# Patient Record
Sex: Female | Born: 1990
Health system: Southern US, Community
[De-identification: ages and names within clinical notes are randomized; demographics above are authoritative.]

## PROBLEM LIST (undated history)

## (undated) DIAGNOSIS — F419 Anxiety disorder, unspecified: Secondary | ICD-10-CM

---
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
## 2010-09-30 ENCOUNTER — Ambulatory Visit: Payer: Self-pay | Admitting: Internal Medicine

## 2011-02-04 ENCOUNTER — Ambulatory Visit: Payer: Self-pay | Admitting: Internal Medicine

## 2011-10-31 ENCOUNTER — Ambulatory Visit: Payer: Self-pay

## 2011-12-10 ENCOUNTER — Ambulatory Visit: Payer: Self-pay | Admitting: Internal Medicine

## 2011-12-10 LAB — RAPID STREP-A WITH REFLX: Micro Text Report: NEGATIVE

## 2011-12-12 LAB — BETA STREP CULTURE(ARMC)

## 2012-03-15 ENCOUNTER — Ambulatory Visit: Payer: Self-pay

## 2012-08-17 ENCOUNTER — Ambulatory Visit: Payer: Self-pay | Admitting: Emergency Medicine

## 2012-08-17 LAB — RAPID INFLUENZA A&B ANTIGENS

## 2012-08-17 LAB — RAPID STREP-A WITH REFLX: Micro Text Report: NEGATIVE

## 2012-08-20 LAB — BETA STREP CULTURE(ARMC)

## 2013-07-24 ENCOUNTER — Ambulatory Visit: Payer: Self-pay | Admitting: Family Medicine

## 2013-07-24 LAB — RAPID INFLUENZA A&B ANTIGENS

## 2013-08-09 DIAGNOSIS — J338 Other polyp of sinus: Secondary | ICD-10-CM | POA: Insufficient documentation

## 2014-07-05 DIAGNOSIS — O039 Complete or unspecified spontaneous abortion without complication: Secondary | ICD-10-CM | POA: Insufficient documentation

## 2018-07-29 DIAGNOSIS — F419 Anxiety disorder, unspecified: Secondary | ICD-10-CM

## 2018-07-29 HISTORY — DX: Anxiety disorder, unspecified: F41.9

## 2019-02-09 NOTE — Progress Notes (Signed)
PCP:  Patient, No Pcp Per   Chief Complaint  Patient presents with  . Gynecologic Exam     HPI:      Carol Steele is a 28 y.o. No obstetric history on file. who LMP was Patient's last menstrual period was 02/06/2019 (exact date)., presents today for her NP annual examination.  Her menses are regular every 28-30 days, lasting 5 days.  Dysmenorrhea mild, occurring first 1-2 days of flow. She does not have intermenstrual bleeding.  Sex activity: single partner, contraception - OCP (estrogen/progesterone). She is thinking about conception in a few months. Not taking PNVs currently. Last Pap: not recent; no hx of abn Hx of STDs: none  There is a FH of breast cancer in her MGM, genetic testing not indicated. There is no FH of ovarian cancer. The patient does not do self-breast exams.  Tobacco use: The patient denies current or previous tobacco use. Alcohol use: none No drug use.  Exercise: moderately active  She does get adequate calcium and Vitamin D in her diet.  Hx of borderline thyroid in past and was treated with synthroid short-term. Never had any sx. Subsequent thyroid checks normal after stopping med. Due for recheck.  Past Medical History:  Diagnosis Date  . Anxiety 2020    History reviewed. No pertinent surgical history.  Family History  Problem Relation Age of Onset  . Anxiety disorder Mother   . Hypertension Father   . Heart attack Father   . Breast cancer Maternal Grandmother 60       has contact  . Prostate cancer Maternal Grandfather 60  . Diabetes Maternal Grandfather   . Heart failure Maternal Grandfather   . Kidney disease Maternal Grandfather     Social History   Socioeconomic History  . Marital status: Married    Spouse name: Not on file  . Number of children: Not on file  . Years of education: Not on file  . Highest education level: Not on file  Occupational History  . Not on file  Social Needs  . Financial resource strain: Not on file   . Food insecurity    Worry: Not on file    Inability: Not on file  . Transportation needs    Medical: Not on file    Non-medical: Not on file  Tobacco Use  . Smoking status: Former Games developermoker  . Smokeless tobacco: Never Used  Substance and Sexual Activity  . Alcohol use: Yes    Comment: occ  . Drug use: Never  . Sexual activity: Yes    Birth control/protection: Pill  Lifestyle  . Physical activity    Days per week: Not on file    Minutes per session: Not on file  . Stress: Not on file  Relationships  . Social Musicianconnections    Talks on phone: Not on file    Gets together: Not on file    Attends religious service: Not on file    Active member of club or organization: Not on file    Attends meetings of clubs or organizations: Not on file    Relationship status: Not on file  . Intimate partner violence    Fear of current or ex partner: Not on file    Emotionally abused: Not on file    Physically abused: Not on file    Forced sexual activity: Not on file  Other Topics Concern  . Not on file  Social History Narrative  . Not on file  Outpatient Medications Prior to Visit  Medication Sig Dispense Refill  . SPRINTEC 28 0.25-35 MG-MCG tablet      No facility-administered medications prior to visit.       ROS:  Review of Systems  Constitutional: Negative for fatigue, fever and unexpected weight change.  Respiratory: Negative for cough, shortness of breath and wheezing.   Cardiovascular: Negative for chest pain, palpitations and leg swelling.  Gastrointestinal: Negative for blood in stool, constipation, diarrhea, nausea and vomiting.  Endocrine: Negative for cold intolerance, heat intolerance and polyuria.  Genitourinary: Negative for dyspareunia, dysuria, flank pain, frequency, genital sores, hematuria, menstrual problem, pelvic pain, urgency, vaginal bleeding, vaginal discharge and vaginal pain.  Musculoskeletal: Negative for back pain, joint swelling and myalgias.   Skin: Negative for rash.  Neurological: Negative for dizziness, syncope, light-headedness, numbness and headaches.  Hematological: Negative for adenopathy.  Psychiatric/Behavioral: Negative for agitation, confusion, sleep disturbance and suicidal ideas. The patient is not nervous/anxious.    BREAST: No symptoms   Objective: BP 112/78   Ht 5\' 5"  (1.651 m)   Wt 147 lb 3.2 oz (66.8 kg)   LMP 02/06/2019 (Exact Date)   BMI 24.50 kg/m    Physical Exam Constitutional:      Appearance: She is well-developed.  Genitourinary:     Vulva, vagina, cervix, uterus, right adnexa and left adnexa normal.     No vulval lesion or tenderness noted.     No vaginal discharge, erythema or tenderness.     No cervical polyp.     Uterus is not enlarged or tender.     No right or left adnexal mass present.     Right adnexa not tender.     Left adnexa not tender.  Neck:     Musculoskeletal: Normal range of motion.     Thyroid: No thyromegaly.  Cardiovascular:     Rate and Rhythm: Normal rate and regular rhythm.     Heart sounds: Normal heart sounds. No murmur.  Pulmonary:     Effort: Pulmonary effort is normal.     Breath sounds: Normal breath sounds.  Chest:     Breasts:        Right: No mass, nipple discharge, skin change or tenderness.        Left: No mass, nipple discharge, skin change or tenderness.  Abdominal:     Palpations: Abdomen is soft.     Tenderness: There is no abdominal tenderness. There is no guarding.  Musculoskeletal: Normal range of motion.  Neurological:     General: No focal deficit present.     Mental Status: She is alert and oriented to person, place, and time.     Cranial Nerves: No cranial nerve deficit.  Skin:    General: Skin is warm and dry.  Psychiatric:        Mood and Affect: Mood normal.        Behavior: Behavior normal.        Thought Content: Thought content normal.        Judgment: Judgment normal.  Vitals signs reviewed.     Assessment/Plan:  Encounter for annual routine gynecological examination -   Cervical cancer screening - Plan: Cytology - PAP,  Encounter for surveillance of contraceptive pills - Plan: Pt to cont OCPs for now. Has Rx. Probably wants to stop soon for conception. F/u prn for Rx.  Pre-conception counseling - Plan: start PNVs.  Thyroid disorder screening - Plan: TSH + free T4, Will call with results.  GYN counsel adequate intake of calcium and vitamin D, diet and exercise     F/U  Return in about 1 year (around 02/10/2020).   B. , PA-C 02/10/2019 11:46 AM

## 2019-02-10 ENCOUNTER — Other Ambulatory Visit: Payer: Self-pay

## 2019-02-10 ENCOUNTER — Other Ambulatory Visit (HOSPITAL_COMMUNITY)
Admission: RE | Admit: 2019-02-10 | Discharge: 2019-02-10 | Disposition: A | Discharge status: Home / Self Care | Payer: BC Managed Care – PPO | Source: Ambulatory Visit | Attending: Obstetrics and Gynecology | Admitting: Obstetrics and Gynecology

## 2019-02-10 ENCOUNTER — Encounter: Payer: Self-pay | Admitting: Obstetrics and Gynecology

## 2019-02-10 ENCOUNTER — Ambulatory Visit (INDEPENDENT_AMBULATORY_CARE_PROVIDER_SITE_OTHER): Payer: BC Managed Care – PPO | Admitting: Obstetrics and Gynecology

## 2019-02-10 VITALS — BP 112/78 | Ht 65.0 in | Wt 147.2 lb

## 2019-02-10 DIAGNOSIS — Z124 Encounter for screening for malignant neoplasm of cervix: Secondary | ICD-10-CM | POA: Diagnosis present

## 2019-02-10 DIAGNOSIS — Z01419 Encounter for gynecological examination (general) (routine) without abnormal findings: Secondary | ICD-10-CM | POA: Diagnosis not present

## 2019-02-10 DIAGNOSIS — Z3041 Encounter for surveillance of contraceptive pills: Secondary | ICD-10-CM

## 2019-02-10 DIAGNOSIS — Z3169 Encounter for other general counseling and advice on procreation: Secondary | ICD-10-CM

## 2019-02-10 DIAGNOSIS — Z1329 Encounter for screening for other suspected endocrine disorder: Secondary | ICD-10-CM

## 2019-02-10 NOTE — Patient Instructions (Signed)
I value your feedback and entrusting us with your care. If you get a [facility] patient survey, I would appreciate you taking the time to let us know about your experience today. Thank you! 

## 2019-02-11 LAB — CYTOLOGY - PAP: Diagnosis: NEGATIVE

## 2019-02-11 LAB — TSH+FREE T4
Free T4: 1.23 ng/dL (ref 0.82–1.77)
TSH: 2.87 u[IU]/mL (ref 0.450–4.500)

## 2019-02-11 NOTE — Progress Notes (Signed)
Pls let pt know thyroid labs normal. Thx.

## 2019-02-11 NOTE — Progress Notes (Signed)
Pt aware.

## 2019-02-11 NOTE — Progress Notes (Signed)
Called, no answer, LVMTRC. 

## 2019-08-12 ENCOUNTER — Other Ambulatory Visit: Payer: Self-pay

## 2019-08-12 ENCOUNTER — Encounter: Payer: Self-pay | Admitting: Advanced Practice Midwife

## 2019-08-12 ENCOUNTER — Other Ambulatory Visit (HOSPITAL_COMMUNITY)
Admission: RE | Admit: 2019-08-12 | Discharge: 2019-08-12 | Disposition: A | Discharge status: Home / Self Care | Payer: BC Managed Care – PPO | Source: Ambulatory Visit | Attending: Obstetrics and Gynecology | Admitting: Obstetrics and Gynecology

## 2019-08-12 ENCOUNTER — Ambulatory Visit (INDEPENDENT_AMBULATORY_CARE_PROVIDER_SITE_OTHER): Payer: BC Managed Care – PPO | Admitting: Advanced Practice Midwife

## 2019-08-12 ENCOUNTER — Encounter: Payer: BC Managed Care – PPO | Admitting: Obstetrics and Gynecology

## 2019-08-12 VITALS — BP 118/74 | Wt 145.0 lb

## 2019-08-12 DIAGNOSIS — Z3481 Encounter for supervision of other normal pregnancy, first trimester: Secondary | ICD-10-CM

## 2019-08-12 DIAGNOSIS — Z3400 Encounter for supervision of normal first pregnancy, unspecified trimester: Secondary | ICD-10-CM | POA: Diagnosis not present

## 2019-08-12 DIAGNOSIS — Z113 Encounter for screening for infections with a predominantly sexual mode of transmission: Secondary | ICD-10-CM | POA: Insufficient documentation

## 2019-08-12 DIAGNOSIS — Z3A01 Less than 8 weeks gestation of pregnancy: Secondary | ICD-10-CM | POA: Diagnosis not present

## 2019-08-12 NOTE — Progress Notes (Signed)
NOB today. LMP 06/20/2019 

## 2019-08-12 NOTE — Progress Notes (Signed)
New Obstetric Patient H&P    Chief Complaint: "Desires prenatal care"   History of Present Illness: Patient is a 29 y.o. G2P0010 Not Hispanic or Latino female, presents with amenorrhea and positive home pregnancy test. Patient's last menstrual period was 06/20/2019 (exact date). and based on her  LMP, her EDD is Estimated Date of Delivery: 03/26/20 and her EGA is [redacted]w[redacted]d. Cycles are 5. days, regular, and occur approximately every : 28 days. Her last pap smear was 6 months ago and was no abnormalities.    She had a urine pregnancy test which was positive 3 week(s)  ago. Her last menstrual period was normal and lasted for  5 day(s). Since her LMP she claims she has experienced breast tenderness and fatigue. She denies vaginal bleeding. Her past medical history is noncontributory. Her prior pregnancies are notable for SAB in 2015 without complication  Since her LMP, she admits to the use of tobacco products  no She claims she has gained   5 pounds since the start of her pregnancy.  There are cats in the home in the home  yes If yes Indoor She admits close contact with children on a regular basis  no  She has had chicken pox in the past yes She has had Tuberculosis exposures, symptoms, or previously tested positive for TB   no Current or past history of domestic violence. no  Genetic Screening/Teratology Counseling: (Includes patient, baby's father, or anyone in either family with:)   1. Patient's age >/= 42 at [facility]  no 2. Thalassemia ([place], [place], Mediterranean, or Asian background): MCV<80  no 3. Neural tube defect (meningomyelocele, spina bifida, anencephaly)  no 4. Congenital heart defect  no  5. Down syndrome  no 6. Tay-Sachs (Jewish, [place]  no 7. Canavan's Disease  no 8. Sickle cell disease or trait (African)  no  9. Hemophilia or other blood disorders  no  10. Muscular dystrophy  no  11. Cystic fibrosis  no  12. Huntington's Chorea  no  13. Mental retardation/autism   no 14. Other inherited genetic or chromosomal disorder  no 15. Maternal metabolic disorder (DM, PKU, etc)  no 16. Patient or FOB with a child with a birth defect not listed above no  16a. Patient or FOB with a birth defect themselves no 17. Recurrent pregnancy loss, or stillbirth  no  18. Any medications since LMP other than prenatal vitamins (include vitamins, supplements, OTC meds, drugs, alcohol)  no 19. Any other genetic/environmental exposure to discuss  no  Infection History:   1. Lives with someone with TB or TB exposed  no  2. Patient or partner has history of genital herpes  no 3. Rash or viral illness since LMP  no 4. History of STI (GC, CT, HPV, syphilis, HIV)  no 5. History of recent travel :  no  Other pertinent information:  no     Review of Systems:10 point review of systems negative unless otherwise noted in HPI  Past Medical History:  Past Medical History:  Diagnosis Date  . Anxiety 2020    Past Surgical History:  History reviewed. No pertinent surgical history.  Gynecologic History: Patient's last menstrual period was 06/20/2019 (exact date).  Obstetric History: G2P0010  Family History:  Family History  Problem Relation Age of Onset  . Anxiety disorder Mother   . Hypertension Father   . Heart attack Father   . Breast cancer Maternal Grandmother 60       has contact  .  Prostate cancer Maternal Grandfather 60  . Diabetes Maternal Grandfather   . Heart failure Maternal Grandfather   . Kidney disease Maternal Grandfather     Social History:  Social History   Socioeconomic History  . Marital status: Married    Spouse name: Not on file  . Number of children: Not on file  . Years of education: Not on file  . Highest education level: Not on file  Occupational History  . Not on file  Tobacco Use  . Smoking status: Former Research scientist (life sciences)  . Smokeless tobacco: Never Used  Substance and Sexual Activity  . Alcohol use: Not Currently    Comment: occ  .  Drug use: Never  . Sexual activity: Yes    Birth control/protection: None  Other Topics Concern  . Not on file  Social History Narrative  . Not on file   Social Determinants of Health   Financial Resource Strain:   . Difficulty of Paying Living Expenses: Not on file  Food Insecurity:   . Worried About Charity fundraiser in the Last Year: Not on file  . Ran Out of Food in the Last Year: Not on file  Transportation Needs:   . Lack of Transportation (Medical): Not on file  . Lack of Transportation (Non-Medical): Not on file  Physical Activity:   . Days of Exercise per Week: Not on file  . Minutes of Exercise per Session: Not on file  Stress:   . Feeling of Stress : Not on file  Social Connections:   . Frequency of Communication with Friends and Family: Not on file  . Frequency of Social Gatherings with Friends and Family: Not on file  . Attends Religious Services: Not on file  . Active Member of Clubs or Organizations: Not on file  . Attends Archivist Meetings: Not on file  . Marital Status: Not on file  Intimate Partner Violence:   . Fear of Current or Ex-Partner: Not on file  . Emotionally Abused: Not on file  . Physically Abused: Not on file  . Sexually Abused: Not on file    Allergies:  Allergies  Allergen Reactions  . Amoxicillin Rash  . Cefaclor Hives and Rash  . Clarithromycin Rash  . Penicillins Hives and Rash  . Sulfamethoxazole-Trimethoprim Rash    Medications: Prior to Admission medications   Medication Sig Start Date End Date Taking? Authorizing Provider  Prenatal Vit-Fe Fumarate-FA (PRENATAL VITAMINS PO) Take by mouth.   Yes [provider]    Physical Exam Vitals: Blood pressure 118/74, weight 145 lb (65.8 kg), last menstrual period 06/20/2019.  General: NAD HEENT: normocephalic, anicteric Thyroid: no enlargement, no palpable nodules Pulmonary: No increased work of breathing, CTAB Cardiovascular: RRR, distal pulses 2+  Abdomen: NABS, soft, non-tender, non-distended.  Umbilicus without lesions.  No hepatomegaly, splenomegaly or masses palpable. No evidence of hernia  Genitourinary: deferred for no concerns/PAP interval/early pregnancy/pt collected aptima Extremities: no edema, erythema, or tenderness Neurologic: Grossly intact Psychiatric: mood appropriate, affect full   Assessment: 29 y.o. G2P0010 at [redacted]w[redacted]d presenting to initiate prenatal care  Plan: 1) Avoid alcoholic beverages. 2) Patient encouraged not to smoke.  3) Discontinue the use of all non-medicinal drugs and chemicals.  4) Take prenatal vitamins daily.  5) Nutrition, food safety (fish, cheese advisories, and high nitrite foods) and exercise discussed. 6) Hospital and practice style discussed with cross coverage system.  7) Genetic Screening, such as with 1st Trimester Screening, cell free fetal DNA, AFP testing, and  Ultrasound, as well as with amniocentesis and CVS as appropriate, is discussed with patient. At the conclusion of today's visit patient requested cell free DNA genetic testing if covered by her insurance and otherwise requests 1st trimester/NT scan 8) Patient is asked about travel to areas at risk for the [place] virus, and counseled to avoid travel and exposure to mosquitoes or sexual partners who may have themselves been exposed to the virus. Testing is discussed, and will be ordered as appropriate.  9) Urine culture, aptima today 10) Return in 1 week for dating scan and rob 89) MaterniT 21 and NOB panel at 10+ weeks   Tresea Mall, CNM [facility] 08/12/2019, 10:18 AM

## 2019-08-12 NOTE — Patient Instructions (Signed)
Genetic Testing During Pregnancy Genetic testing during pregnancy is also called prenatal genetic testing. This type of testing can determine if your baby is at risk of being born with a disorder caused by abnormal genes or chromosomes (genetic disorder). Chromosomes contain genes that control how your baby will develop in your womb. There are many different genetic disorders. Examples of genetic disorders that may be found through genetic testing include Down syndrome and cystic fibrosis. Gene changes (mutations) can be passed down through families. Genetic testing is offered to all women before or during pregnancy. You can choose whether to have genetic testing. Why is genetic testing done? Genetic testing is done during pregnancy to find out whether your child is at risk for a genetic disorder. Having genetic testing allows you to:  Discuss your test results and options with a genetic counselor.  Prepare for a baby that may be born with a genetic disorder. Learning about the disorder ahead of time helps you be better prepared to manage it. Your health care providers can also be prepared in case your baby requires special care before or after birth.  Consider whether you want to continue with the pregnancy. In some cases, genetic testing may be done to learn about the traits a child will inherit. Types of genetic tests There are two basic types of genetic testing. Screening tests indicate whether your developing baby (fetus) is at higher risk for a genetic disorder. Diagnostic tests check actual fetal cells to diagnose a genetic disorder. Screening tests     Screening tests will not harm your baby. They are recommended for all pregnant women. Types of screening tests include:  Carrier screening. This test involves checking genes from both parents by testing their blood or saliva. The test checks to find out if the parents carry a genetic mutation that may be passed to a baby. In most cases,  both parents must carry the mutation for a baby to be at risk.  First trimester screening. This test combines a blood test with sound wave imaging of your baby (fetal ultrasound). This screening test checks for a risk of Down syndrome or other defects caused by having extra chromosomes. It also checks for defects of the heart, abdomen, or skeleton.  Second trimester screening also combines a blood test with a fetal ultrasound exam. It checks for a risk of genetic defects of the face, brain, spine, heart, or limbs.  Combined or sequential screening. This type of testing combines the results of first and second trimester screening. This type of testing may be more accurate than first or second trimester screening alone.  Cell-free DNA testing. This is a blood test that detects cells released by the placenta that get into the mother's blood. It can be used to check for a risk of Down syndrome, other extra chromosome syndromes, and disorders caused by abnormal numbers of sex chromosomes. This test can be done any time after 10 weeks of pregnancy.  Diagnostic tests Diagnostic tests carry slight risks of problems, including bleeding, infection, and loss of the pregnancy. These tests are done only if your baby is at risk for a genetic disorder. You may meet with a genetic counselor to discuss the risks and benefits before having diagnostic tests. Examples of diagnostic tests include:  Chorionic villus sampling (CVS). This involves a procedure to remove and test a sample of cells taken from the placenta. The procedure may be done between 10 and 12 weeks of pregnancy.  Amniocentesis. This involves a  procedure to remove and test a sample of fluid (amniotic fluid) and cells from the sac that surrounds the developing baby. The procedure may be done between 15 and 20 weeks of pregnancy. What do the results mean? For a screening test:  If the results are negative, it often means that your child is not at higher  risk. There is still a slight chance your child could have a genetic disorder.  If the results are positive, it does not mean your child will have a genetic disorder. It may mean that your child has a higher-than-normal risk for a genetic disorder. In that case, you may want to talk with a genetic counselor about whether you should have diagnostic genetic tests. For a diagnostic test:  If the result is negative, it is unlikely that your child will have a genetic disorder.  If the test is positive for a genetic disorder, it is likely that your child will have the disorder. The test may not tell how severe the disorder will be. Talk with your health care provider about your options. Questions to ask your health care provider Before talking to your health care provider about genetic testing, find out if there is a history of genetic disorders in your family. It may also help to know your family's ethnic origins. Then ask your health care provider the following questions:  Is my baby at risk for a genetic disorder?  What are the benefits of having genetic screening?  What tests are best for me and my baby?  What are the risks of each test?  If I get a positive result on a screening test, what is the next step?  Should I meet with a genetic counselor before having a diagnostic test?  Should my partner or other members of my family be tested?  How much do the tests cost? Will my insurance cover the testing? Summary  Genetic testing is done during pregnancy to find out whether your child is at risk for a genetic disorder.  Genetic testing is offered to all women before or during pregnancy. You can choose whether to have genetic testing.  There are two basic types of genetic testing. Screening tests indicate whether your developing baby (fetus) is at higher risk for a genetic disorder. Diagnostic tests check actual fetal cells to diagnose a genetic disorder.  If a diagnostic genetic test is  positive, talk with your health care provider about your options. This information is not intended to replace advice given to you by your health care provider. Make sure you discuss any questions you have with your health care provider. Document Revised: 11/05/2018 Document Reviewed: 09/29/2017 Elsevier Patient Education  2020 [employer] Perinatal Anxiety When a woman feels excessive tension or worry (anxiety) during pregnancy or during the first 12 months after she gives birth, she has a condition called perinatal anxiety. Anxiety can interfere with work, school, relationships, and other everyday activities. If it is not managed properly, it can also cause problems in the mother and her baby.  If you are pregnant and you have symptoms of an anxiety disorder, it is important to talk with your health care provider. What are the causes? The exact cause of this condition is not known. Hormonal changes during and after pregnancy may play a role in causing perinatal anxiety. What increases the risk? You are more likely to develop this condition if:  You have a personal or family history of depression, anxiety, or mood disorders.  You experience  a stressful life event during pregnancy, such as the death of a loved one.  You have a lot of regular life stress, such as being a single parent.  You have thyroid problems. What are the signs or symptoms? Perinatal anxiety can be different for everyone. It may include:  Panic attacks (panic disorder). These are intense episodes of fear or discomfort that may also cause sweating, nausea, shortness of breath, or fear of dying. They usually last 5-15 minutes.  Reliving an upsetting (traumatic) event through distressing thoughts, dreams, or flashbacks (post-traumatic stress disorder, or PTSD).  Excessive worry about multiple problems (generalized anxiety disorder).  Fear and stress about leaving certain people or loved ones (separation  anxiety).  Performing repetitive tasks (compulsions) to relieve stress or worry (obsessive compulsive disorder, or OCD).  Fear of certain objects or situations (phobias).  Excessive worrying, such as a constant feeling that something bad is going to happen.  Inability to relax.  Difficulty concentrating.  Sleep problems.  Frequent nightmares or disturbing thoughts. How is this diagnosed? This condition is diagnosed based on a physical exam and mental evaluation. In some cases, your health care provider may use an anxiety screening tool. These tools include a list of questions that can help a health care provider diagnose anxiety. Your health care provider may refer you to a mental health expert who specializes in anxiety. How is this treated? This condition may be treated with:  Medicines. Your health care provider will only give you medicines that have been proven safe for pregnancy and breastfeeding.  Talk therapy with a mental health professional to help change your patterns of thinking (cognitive behavioral therapy).  Mindfulness-based stress reduction.  Other relaxation therapies, such as deep breathing or guided muscle relaxation.  Support groups. Follow these instructions at home: Lifestyle  Do not use any products that contain nicotine or tobacco, such as cigarettes and e-cigarettes. If you need help quitting, ask your health care provider.  Do not use alcohol when you are pregnant. After your baby is born, limit alcohol intake to no more than 1 drink a day. One drink equals 12 oz of beer, 5 oz of wine, or 1 oz of hard liquor.  Consider joining a support group for new mothers. Ask your health care provider for recommendations.  Take good care of yourself. Make sure you: ? Get plenty of sleep. If you are having trouble sleeping, talk with your health care provider. ? Eat a healthy diet. This includes plenty of fruits and vegetables, whole grains, and lean  proteins. ? Exercise regularly, as told by your health care provider. Ask your health care provider what exercises are safe for you. General instructions  Take over-the-counter and prescription medicines only as told by your health care provider.  Talk with your partner or family members about your feelings during pregnancy. Share any concerns or fears that you may have.  Ask for help with tasks or chores when you need it. Ask friends and family members to provide meals, watch your children, or help with cleaning.  Keep all follow-up visits as told by your health care provider. This is important. Contact a health care provider if:  You (or people close to you) notice that you have any symptoms of anxiety or depression.  You have anxiety and your symptoms get worse.  You experience side effects from medicines, such as nausea or sleep problems. Get help right away if:  You feel like hurting yourself, your baby, or someone else. If  you ever feel like you may hurt yourself or others, or have thoughts about taking your own life, get help right away. You can go to your nearest emergency department or call:  Your local emergency services (911 in the [place]  A suicide crisis helpline, such as the National Suicide Prevention Lifeline at 209-883-88701-317-709-1483. This is open 24 hours a day. Summary  Perinatal anxiety is when a woman feels excessive tension or worry during pregnancy or during the first 12 months after she gives birth.  Perinatal anxiety may include panic attacks, post-traumatic stress disorder, separation anxiety, phobias, or generalized anxiety.  Perinatal anxiety can cause physical health problems in the mother and baby if not properly managed.  This condition is treated with medicines, talk therapy, stress reduction therapies, or a combination of two or more treatments.  Talk with your partner or family members about your concerns or fears. Do not be afraid to ask for help. This  information is not intended to replace advice given to you by your health care provider. Make sure you discuss any questions you have with your health care provider. Document Revised: 07/18/2017 Document Reviewed: 09/11/2016 Elsevier Patient Education  2020 [employer] Exercise During Pregnancy Exercise is an important part of being healthy for people of all ages. Exercise improves the function of your heart and lungs and helps you maintain strength, flexibility, and a healthy body weight. Exercise also boosts energy levels and elevates mood. Most women should exercise regularly during pregnancy. In rare cases, women with certain medical conditions or complications may be asked to limit or avoid exercise during pregnancy. How does this affect me? Along with maintaining general strength and flexibility, exercising during pregnancy can help:  Keep strength in muscles that are used during labor and childbirth.  Decrease low back pain.  Reduce symptoms of depression.  Control weight gain during pregnancy.  Reduce the risk of needing insulin if you develop diabetes during pregnancy.  Decrease the risk of cesarean delivery.  Speed up your recovery after giving birth. How does this affect my baby? Exercise can help you have a healthy pregnancy. Exercise does not cause premature birth. It will not cause your baby to weigh less at birth. What exercises can I do? Many exercises are safe for you to do during pregnancy. Do a variety of exercises that safely increase your heart and breathing rates and help you build and maintain muscle strength. Do exercises exactly as told by your health care provider. You may do these exercises:  Walking or hiking.  Swimming.  Water aerobics.  Riding a stationary bike.  Strength training.  Modified yoga or Pilates. Tell your instructor that you are pregnant. Avoid overstretching, and avoid lying on your back for long periods of time.  Running or  jogging. Only choose this type of exercise if you: ? Ran or jogged regularly before your pregnancy. ? Can run or jog and still talk in complete sentences. What exercises should I avoid? Depending on your level of fitness and whether you exercised regularly before your pregnancy, you may be told to limit high-intensity exercise. You can tell that you are exercising at a high intensity if you are breathing much harder and faster and cannot hold a conversation while exercising. You must avoid:  Contact sports.  Activities that put you at risk for falling on or being hit in the belly, such as downhill skiing, water skiing, surfing, rock climbing, cycling, gymnastics, and horseback riding.  Scuba diving.  Skydiving.  Yoga  or Pilates in a room that is heated to high temperatures.  Jogging or running, unless you ran or jogged regularly before your pregnancy. While jogging or running, you should always be able to talk in full sentences. Do not run or jog so fast that you are unable to have a conversation.  Do not exercise at more than 6,000 feet above sea level (high elevation) if you are not used to exercising at high elevation. How do I exercise in a safe way?   Avoid overheating. Do not exercise in very high temperatures.  Wear loose-fitting, breathable clothes.  Avoid dehydration. Drink enough water before, during, and after exercise to keep your urine pale yellow.  Avoid overstretching. Because of hormone changes during pregnancy, it is easy to overstretch muscles, tendons, and ligaments during pregnancy.  Start slowly and ask your health care provider to recommend the types of exercise that are safe for you.  Do not exercise to lose weight. Follow these instructions at home:  Exercise on most days or all days of the week. Try to exercise for 30 minutes a day, 5 days a week, unless your health care provider tells you not to.  If you actively exercised before your pregnancy and you  are healthy, your health care provider may tell you to continue to do moderate to high-intensity exercise.  If you are just starting to exercise or did not exercise much before your pregnancy, your health care provider may tell you to do low to moderate-intensity exercise. Questions to ask your health care provider  Is exercise safe for me?  What are signs that I should stop exercising?  Does my health condition mean that I should not exercise during pregnancy?  When should I avoid exercising during pregnancy? Stop exercising and contact a health care provider if: You have any unusual symptoms, such as:  Mild contractions of the uterus or cramps in the abdomen.  Dizziness that does not go away when you rest. Stop exercising and get help right away if: You have any unusual symptoms, such as:  Sudden, severe pain in your low back or your belly.  Mild contractions of the uterus or cramps in the abdomen that do not improve with rest and drinking fluids.  Chest pain.  Bleeding or fluid leaking from your vagina.  Shortness of breath. These symptoms may represent a serious problem that is an emergency. Do not wait to see if the symptoms will go away. Get medical help right away. Call your local emergency services (911 in the [place] Do not drive yourself to the hospital. Summary  Most women should exercise regularly throughout pregnancy. In rare cases, women with certain medical conditions or complications may be asked to limit or avoid exercise during pregnancy.  Do not exercise to lose weight during pregnancy.  Your health care provider will tell you what level of physical activity is right for you.  Stop exercising and contact a health care provider if you have mild contractions of the uterus or cramps in the abdomen. Get help right away if these contractions or cramps do not improve with rest and drinking fluids.  Stop exercising and get help right away if you have sudden, severe  pain in your low back or belly, chest pain, shortness of breath, or bleeding or leaking of fluid from your vagina. This information is not intended to replace advice given to you by your health care provider. Make sure you discuss any questions you have with your health care  provider. Document Revised: 11/05/2018 Document Reviewed: 08/19/2018 Elsevier Patient Education  2020 [employer]. Eating Plan for Pregnant Women While you are pregnant, your body requires additional nutrition to help support your growing baby. You also have a higher need for some vitamins and minerals, such as folic acid, calcium, iron, and vitamin D. Eating a healthy, well-balanced diet is very important for your health and your baby's health. Your need for extra calories varies for the three 59-month segments of your pregnancy (trimesters). For most women, it is recommended to consume:  150 extra calories a day during the first trimester.  300 extra calories a day during the second trimester.  300 extra calories a day during the third trimester. What are tips for following this plan?   Do not try to lose weight or go on a diet during pregnancy.  Limit your overall intake of foods that have "empty calories." These are foods that have little nutritional value, such as sweets, desserts, candies, and sugar-sweetened beverages.  Eat a variety of foods (especially fruits and vegetables) to get a full range of vitamins and minerals.  Take a prenatal vitamin to help meet your additional vitamin and mineral needs during pregnancy, specifically for folic acid, iron, calcium, and vitamin D.  Remember to stay active. Ask your health care provider what types of exercise and activities are safe for you.  Practice good food safety and cleanliness. Wash your hands before you eat and after you prepare raw meat. Wash all fruits and vegetables well before peeling or eating. Taking these actions can help to prevent food-borne illnesses  that can be very dangerous to your baby, such as listeriosis. Ask your health care provider for more information about listeriosis. What does 150 extra calories look like? Healthy options that provide 150 extra calories each day could be any of the following:  6-8 oz (170-230 g) of plain low-fat yogurt with  cup of berries.  1 apple with 2 teaspoons (11 g) of peanut butter.  Cut-up vegetables with  cup (60 g) of hummus.  8 oz (230 mL) or 1 cup of low-fat chocolate milk.  1 stick of string cheese with 1 medium orange.  1 peanut butter and jelly sandwich that is made with one slice of whole-wheat bread and 1 tsp (5 g) of peanut butter. For 300 extra calories, you could eat two of those healthy options each day. What is a healthy amount of weight to gain? The right amount of weight gain for you is based on your BMI before you became pregnant. If your BMI:  Was less than 18 (underweight), you should gain 28-40 lb (13-18 kg).  Was 18-24.9 (normal), you should gain 25-35 lb (11-16 kg).  Was 25-29.9 (overweight), you should gain 15-25 lb (7-11 kg).  Was 30 or greater (obese), you should gain 11-20 lb (5-9 kg). What if I am having twins or multiples? Generally, if you are carrying twins or multiples:  You may need to eat 300-600 extra calories a day.  The recommended range for total weight gain is 25-54 lb (11-25 kg), depending on your BMI before pregnancy.  Talk with your health care provider to find out about nutritional needs, weight gain, and exercise that is right for you. What foods can I eat?  Fruits All fruits. Eat a variety of colors and types of fruit. Remember to wash your fruits well before peeling or eating. Vegetables All vegetables. Eat a variety of colors and types of vegetables. Remember to wash  your vegetables well before peeling or eating. Grains All grains. Choose whole grains, such as whole-wheat bread, oatmeal, or brown rice. Meats and other protein  foods Lean meats, including chicken, [place], fish, and lean cuts of beef, veal, or pork. If you eat fish or seafood, choose options that are higher in omega-3 fatty acids and lower in mercury, such as salmon, herring, mussels, trout, sardines, pollock, shrimp, crab, and lobster. Tofu. Tempeh. Beans. Eggs. Peanut butter and other nut butters. Make sure that all meats, poultry, and eggs are cooked to food-safe temperatures or "well-done." Two or more servings of fish are recommended each week in order to get the most benefits from omega-3 fatty acids that are found in seafood. Choose fish that are lower in mercury. You can find more information online:  [employer]ee Dairy Pasteurized milk and milk alternatives (such as almond milk). Pasteurized yogurt and pasteurized cheese. Cottage cheese. Sour cream. Beverages Water. Juices that contain 100% fruit juice or vegetable juice. Caffeine-free teas and decaffeinated coffee. Drinks that contain caffeine are okay to drink, but it is better to avoid caffeine. Keep your total caffeine intake to less than 200 mg each day (which is 12 oz or 355 mL of coffee, tea, or soda) or the limit as told by your health care provider. Fats and oils Fats and oils are okay to include in moderation. Sweets and desserts Sweets and desserts are okay to include in moderation. Seasoning and other foods All pasteurized condiments. The items listed above may not be a complete list of foods and beverages you can eat. Contact a dietitian for more information. What foods are not recommended? Fruits Unpasteurized fruit juices. Vegetables Raw (unpasteurized) vegetable juices. Meats and other protein foods Lunch meats, bologna, hot dogs, or other deli meats. (If you must eat those meats, reheat them until they are steaming hot.) Refrigerated pat, meat spreads from a meat counter, smoked seafood that is found in the refrigerated section of a store. Raw or undercooked meats, poultry,  and eggs. Raw fish, such as sushi or sashimi. Fish that have high mercury content, such as tilefish, shark, swordfish, and king mackerel. To learn more about mercury in fish, talk with your health care provider or look for online resources, such as:  [employer]ee Dairy Raw (unpasteurized) milk and any foods that have raw milk in them. Soft cheeses, such as feta, queso blanco, queso fresco, Brie, Camembert cheeses, blue-veined cheeses, and Panela cheese (unless it is made with pasteurized milk, which must be stated on the label). Beverages Alcohol. Sugar-sweetened beverages, such as sodas, teas, or energy drinks. Seasoning and other foods Homemade fermented foods and drinks, such as pickles, sauerkraut, or kombucha drinks. (Store-bought pasteurized versions of these are okay.) Salads that are made in a store or deli, such as ham salad, chicken salad, egg salad, tuna salad, and seafood salad. The items listed above may not be a complete list of foods and beverages you should avoid. Contact a dietitian for more information. Where to find more information To calculate the number of calories you need based on your height, weight, and activity level, you can use an online calculator such as:  PackageNews.is To calculate how much weight you should gain during pregnancy, you can use an online pregnancy weight gain calculator such as:  http://jones-berg.com/ Summary  While you are pregnant, your body requires additional nutrition to help support your growing baby.  Eat a variety of foods, especially fruits and vegetables to get a full range of vitamins and  minerals.  Practice good food safety and cleanliness. Wash your hands before you eat and after you prepare raw meat. Wash all fruits and vegetables well before peeling or eating. Taking these actions can help to prevent food-borne illnesses, such as listeriosis, that can be very dangerous to your  baby.  Do not eat raw meat or fish. Do not eat fish that have high mercury content, such as tilefish, shark, swordfish, and king mackerel. Do not eat unpasteurized (raw) dairy.  Take a prenatal vitamin to help meet your additional vitamin and mineral needs during pregnancy, specifically for folic acid, iron, calcium, and vitamin D. This information is not intended to replace advice given to you by your health care provider. Make sure you discuss any questions you have with your health care provider. Document Revised: 12/03/2018 Document Reviewed: 04/11/2017 Elsevier Patient Education  2020 [employer]. Prenatal Care Prenatal care is health care during pregnancy. It helps you and your unborn baby (fetus) stay as healthy as possible. Prenatal care may be provided by a midwife, a family practice health care provider, or a childbirth and pregnancy specialist (obstetrician). How does this affect me? During pregnancy, you will be closely monitored for any new conditions that might develop. To lower your risk of pregnancy complications, you and your health care provider will talk about any underlying conditions you have. How does this affect my baby? Early and consistent prenatal care increases the chance that your baby will be healthy during pregnancy. Prenatal care lowers the risk that your baby will be:  Born early (prematurely).  Smaller than expected at birth (small for gestational age). What can I expect at the first prenatal care visit? Your first prenatal care visit will likely be the longest. You should schedule your first prenatal care visit as soon as you know that you are pregnant. Your first visit is a good time to talk about any questions or concerns you have about pregnancy. At your visit, you and your health care provider will talk about:  Your medical history, including: ? Any past pregnancies. ? Your family's medical history. ? The baby's father's medical history. ? Any  long-term (chronic) health conditions you have and how you manage them. ? Any surgeries or procedures you have had. ? Any current over-the-counter or prescription medicines, herbs, or supplements you are taking.  Other factors that could pose a risk to your baby, including:  Your home setting and your stress levels, including: ? Exposure to abuse or violence. ? Household financial strain. ? Mental health conditions you have.  Your daily health habits, including diet and exercise. Your health care provider will also:  Measure your weight, height, and blood pressure.  Do a physical exam, including a pelvic and breast exam.  Perform blood tests and urine tests to check for: ? Urinary tract infection. ? Sexually transmitted infections (STIs). ? Low iron levels in your blood (anemia). ? Blood type and certain proteins on red blood cells (Rh antibodies). ? Infections and immunity to viruses, such as hepatitis B and rubella. ? HIV (human immunodeficiency virus).  Do an ultrasound to confirm your baby's growth and development and to help predict your estimated due date (EDD). This ultrasound is done with a probe that is inserted into the vagina (transvaginal ultrasound).  Discuss your options for genetic screening.  Give you information about how to keep yourself and your baby healthy, including: ? Nutrition and taking vitamins. ? Physical activity. ? How to manage pregnancy symptoms such as nausea  and vomiting (morning sickness). ? Infections and substances that may be harmful to your baby and how to avoid them. ? Food safety. ? Dental care. ? Working. ? Travel. ? Warning signs to watch for and when to call your health care provider. How often will I have prenatal care visits? After your first prenatal care visit, you will have regular visits throughout your pregnancy. The visit schedule is often as follows:  Up to week 28 of pregnancy: once every 4 weeks.  28-36 weeks: once  every 2 weeks.  After 36 weeks: every week until delivery. Some women may have visits more or less often depending on any underlying health conditions and the health of the baby. Keep all follow-up and prenatal care visits as told by your health care provider. This is important. What happens during routine prenatal care visits? Your health care provider will:  Measure your weight and blood pressure.  Check for fetal heart sounds.  Measure the height of your uterus in your abdomen (fundal height). This may be measured starting around week 20 of pregnancy.  Check the position of your baby inside your uterus.  Ask questions about your diet, sleeping patterns, and whether you can feel the baby move.  Review warning signs to watch for and signs of labor.  Ask about any pregnancy symptoms you are having and how you are dealing with them. Symptoms may include: ? Headaches. ? Nausea and vomiting. ? Vaginal discharge. ? Swelling. ? Fatigue. ? Constipation. ? Any discomfort, including back or pelvic pain. Make a list of questions to ask your health care provider at your routine visits. What tests might I have during prenatal care visits? You may have blood, urine, and imaging tests throughout your pregnancy, such as:  Urine tests to check for glucose, protein, or signs of infection.  Glucose tests to check for a form of diabetes that can develop during pregnancy (gestational diabetes mellitus). This is usually done around week 24 of pregnancy.  An ultrasound to check your baby's growth and development and to check for birth defects. This is usually done around week 20 of pregnancy.  A test to check for group B strep (GBS) infection. This is usually done around week 36 of pregnancy.  Genetic testing. This may include blood or imaging tests, such as an ultrasound. Some genetic tests are done during the first trimester and some are done during the second trimester. What else can I expect  during prenatal care visits? Your health care provider may recommend getting certain vaccines during pregnancy. These may include:  A yearly flu shot (annual influenza vaccine). This is especially important if you will be pregnant during flu season.  Tdap (tetanus, diphtheria, pertussis) vaccine. Getting this vaccine during pregnancy can protect your baby from whooping cough (pertussis) after birth. This vaccine may be recommended between weeks 27 and 36 of pregnancy. Later in your pregnancy, your health care provider may give you information about:  Childbirth and breastfeeding classes.  Choosing a health care provider for your baby.  Umbilical cord banking.  Breastfeeding.  Birth control after your baby is born.  The hospital labor and delivery unit and how to tour it.  Registering at the hospital before you go into labor. Where to find more information  Office on Women's Health: TravelLesson.cawomenshealth.gov  American Pregnancy Association: americanpregnancy.org  March of Dimes: marchofdimes.org Summary  Prenatal care helps you and your baby stay as healthy as possible during pregnancy.  Your first prenatal care visit will  most likely be the longest.  You will have visits and tests throughout your pregnancy to monitor your health and your baby's health.  Bring a list of questions to your visits to ask your health care provider.  Make sure to keep all follow-up and prenatal care visits with your health care provider. This information is not intended to replace advice given to you by your health care provider. Make sure you discuss any questions you have with your health care provider. Document Revised: 11/04/2018 Document Reviewed: 07/14/2017 Elsevier Patient Education  2020 [employer].    COVID-19 and Your Pregnancy FAQ  How can I prevent infection with COVID-19 during my pregnancy? Social distancing is key. Please limit any interactions in public. Try and work from home if  possible. Frequently wash your hands after touching possibly contaminated surfaces. Avoid touching your face.  Minimize trips to the store. Consider online ordering when possible.   Should I wear a mask? YES. It is recommended by the CDC that all people wear a cloth mask or facial covering in public. You should wear a mask to your visits in the office. This will help reduce transmission as well as your risk or acquiring COVID-19. New studies are showing that even asymptomatic individuals can spread the virus from talking.   Where can I get a mask? [place] and the city of [place] are partnering to provide masks to community members. You can pick up a mask from several locations. This website also has instructions about how to make a mask by sewing or without sewing by using a t-shirt or bandana.  https://www.[place].gov/i-want-to/learn-about/covid-19-information-and-updates/covid-19-face-mask-project  Studies have shown that if you were a tube or nylon stocking from pantyhose over a cloth mask it makes the cloth mask almost as effective as a N95 mask.  AntiquesInvestors.de  What are the symptoms of COVID-19? Fever (greater than 100.4 F), dry cough, shortness of breath.  Am I more at risk for COVID-19 since I am pregnant? There is not currently data showing that pregnant women are more adversely impacted by COVID-19 than the general population. However, we know that pregnant women tend to have worse respiratory complications from similar diseases such as the flu and SARS and for this reason should be considered an at-risk population.  What do I do if I am experiencing the symptoms of COVID-19? Testing is being limited because of test availability. If you are experiencing symptoms you should quarantine yourself, and the members of your family, for at least 2 weeks  at home.   Please visit this website for more information: DiscoHelp.si.html  When should I go to the Emergency Room? Please go to the emergency room if you are experiencing ANY of these symptoms*:  1.    Difficulty breathing or shortness of breath 2.    Persistent pain or pressure in the chest 3.    Confusion or difficulty being aroused (or awakened) 4.    Bluish lips or face  *This list is not all inclusive. Please consult our office for any other symptoms that are severe or concerning.  What do I do if I am having difficulty breathing? You should go to the Emergency Room for evaluation. At this time they have a tent set up for evaluating patients with COVID-19 symptoms.   How will my prenatal care be different because of the COVID-19 pandemic? It has been recommended to reduce the frequency of face-to-face visits and use resources such as telephone and virtual visits when possible. Using a  scale, blood pressure machine and fetal doppler at home can further help reduce face-to-face visits. You will be provided with additional information on this topic.  We ask that you come to your visits alone to minimize potential exposures to  COVID-19.  How can I receive childbirth education? At this time in-person classes have been cancelled. You can register for online childbirth education, breastfeeding, and newborn care classes.  Please visit:  [email or web address].is for more information  How will my hospital birth experience be different? The hospital is currently limiting visitors. This means that while you are in labor you can only have one person at the hospital with you. Additional family members will not be allowed to wait in the building or outside your room. Your one support person can be the father of the baby, a relative, a doula, or a friend. Once one support person is designated that person will wear a band. This band  cannot be shared with multiple people.  Nitrous Gas is not being offered for pain relief since the tubing and filter for the machine can not be sanitized in a way to guarantee prevention of transmission of COVID-19.  Nasal cannula use of oxygen for fetal indications has also been discontinued.  Currently a clear plastic sheet is being hung between mom and the delivering provider during pushing and delivery to help prevent transmission of COVID-19.      How long will I stay in the hospital for after giving birth? It is also recommended that discharge home be expedited during the COVID-19 outbreak. This means staying for 1 day after a vaginal delivery and 2 days after a cesarean section. Patients who need to stay longer for medical reasons are allowed to do so, but the goal will be for expedited discharge home.   What if I have COVID-19 and I am in labor? We ask that you wear a mask while on labor and delivery. We will try and accommodate you being placed in a room that is capable of filtering the air. Please call ahead if you are in labor and on your way to the hospital. The phone number for labor and delivery at [facility] is [phone].  If I have COVID-19 when my baby is born how can I prevent my baby from contracting COVID-19? This is an issue that will have to be discussed on a case-by-case basis. Current recommendations suggest providing separate isolation rooms for both the mother and new infant as well as limiting visitors. However, there are practical challenges to this recommendation. The situation will assuredly change and decisions will be influenced by the desires of the mother and availability of space.  Some suggestions are the use of a curtain or physical barrier between mom and infant, hand hygiene, mom wearing a mask, or 6 feet of spacing between a mom and infant.   Can I breastfeed during the COVID-19 pandemic?   Yes, breastfeeding is encouraged.  Can I  breastfeed if I have COVID-19? Yes. Covid-19 has not been found in breast milk. This means you cannot give COVID-19 to your child through breast milk. Breast feeding will also help pass antibodies to fight infection to your baby.   What precautions should I take when breastfeeding if I have COVID-19? If a mother and newborn do room-in and the mother wishes to feed at the breast, she should put on a facemask and practice hand hygiene before each feeding.  What precautions should I take when pumping if I  have COVID-19? Prior to expressing breast milk, mothers should practice hand hygiene. After each pumping session, all parts that come into contact with breast milk should be thoroughly washed and the entire pump should be appropriately disinfected per the manufacturer's instructions. This expressed breast milk should be fed to the newborn by a healthy caregiver.  What if I am pregnant and work in healthcare? Based on limited data regarding COVID-19 and pregnancy, ACOG currently does not propose creating additional restrictions on pregnant health care personnel because of COVID-19 alone. Pregnant women do not appear to be at higher risk of severe disease related to COVID-19. Pregnant health care personnel should follow CDC risk assessment and infection control guidelines for health care personnel exposed to patients with suspected or confirmed COVID-19. Adherence to recommended infection prevention and control practices is an important part of protecting all health care personnel in health care settings.    Information on COVID-19 in pregnancy is very limited; however, facilities may want to consider limiting exposure of pregnant health care personnel to patients with confirmed or suspected COVID-19 infection, especially during higher-risk procedures (eg, aerosol-generating procedures), if feasible, based on staffing availability.

## 2019-08-13 LAB — CERVICOVAGINAL ANCILLARY ONLY
Chlamydia: NEGATIVE
Comment: NEGATIVE
Comment: NEGATIVE
Comment: NORMAL
Neisseria Gonorrhea: NEGATIVE
Trichomonas: NEGATIVE

## 2019-08-14 LAB — URINE CULTURE: Organism ID, Bacteria: NO GROWTH

## 2019-08-20 ENCOUNTER — Ambulatory Visit (INDEPENDENT_AMBULATORY_CARE_PROVIDER_SITE_OTHER): Payer: BC Managed Care – PPO | Admitting: Certified Nurse Midwife

## 2019-08-20 ENCOUNTER — Other Ambulatory Visit: Payer: Self-pay

## 2019-08-20 ENCOUNTER — Ambulatory Visit (INDEPENDENT_AMBULATORY_CARE_PROVIDER_SITE_OTHER): Payer: BC Managed Care – PPO

## 2019-08-20 VITALS — BP 120/90

## 2019-08-20 DIAGNOSIS — Z3A08 8 weeks gestation of pregnancy: Secondary | ICD-10-CM

## 2019-08-20 DIAGNOSIS — Z3689 Encounter for other specified antenatal screening: Secondary | ICD-10-CM | POA: Diagnosis not present

## 2019-08-20 DIAGNOSIS — Z3400 Encounter for supervision of normal first pregnancy, unspecified trimester: Secondary | ICD-10-CM

## 2019-08-20 DIAGNOSIS — O021 Missed abortion: Secondary | ICD-10-CM | POA: Diagnosis not present

## 2019-08-20 NOTE — Progress Notes (Signed)
C/o miscarriage per u/[email or web address]

## 2019-08-21 LAB — ABO AND RH: Rh Factor: POSITIVE

## 2019-08-22 NOTE — Progress Notes (Signed)
ROB at 8wk5d: Had a dating/viability scan today which revealed a SIUP with  a CRL of 8wk2d, but no FCA Isma is very upset and tearful. Had a prior loss, very early and never had ultrasound that documented a pregnancy at that time She is not having any bleeding or cramping Discussed possible causes and assured her that nothing she did caused the fetal demise.  Recommended repeating scan next week for confirmation of demise and seeing MD for discussion on D&C. SAB precautions ABO and RH today  Farrel Conners, CNM

## 2019-08-24 ENCOUNTER — Ambulatory Visit (INDEPENDENT_AMBULATORY_CARE_PROVIDER_SITE_OTHER): Payer: BC Managed Care – PPO | Admitting: Obstetrics and Gynecology

## 2019-08-24 ENCOUNTER — Other Ambulatory Visit: Payer: Self-pay

## 2019-08-24 ENCOUNTER — Ambulatory Visit (INDEPENDENT_AMBULATORY_CARE_PROVIDER_SITE_OTHER): Payer: BC Managed Care – PPO

## 2019-08-24 ENCOUNTER — Ambulatory Visit: Payer: BC Managed Care – PPO | Attending: Internal Medicine

## 2019-08-24 ENCOUNTER — Encounter: Payer: Self-pay | Admitting: Obstetrics and Gynecology

## 2019-08-24 DIAGNOSIS — O021 Missed abortion: Secondary | ICD-10-CM

## 2019-08-24 DIAGNOSIS — Z3A08 8 weeks gestation of pregnancy: Secondary | ICD-10-CM

## 2019-08-24 DIAGNOSIS — Z20822 Contact with and (suspected) exposure to covid-19: Secondary | ICD-10-CM

## 2019-08-24 NOTE — Progress Notes (Signed)
Obstetrics & Gynecology Surgery H&P    Chief Complaint: Scheduled Surgery   History of Present Illness: Patient is a 29 y.o. G2P0010 presenting for scheduled suction dilation and curettage, for the treatment or further evaluation of missed abortion.   Prior Treatments prior to proceeding with surgery include: serial ultrasound, discussion of available management options  Preoperative Pap: 02/10/2019 NIL Preoperative Endometrial biopsy: N/A Preoperative Ultrasound: 08/20/2019 and 08/24/2019 both showing absence of fetal cardiac activity at a CRL >65mm   Review of Systems:10 point review of systems  Past Medical History:  Past Medical History:  Diagnosis Date  . Anxiety 2020    Past Surgical History:  History reviewed. No pertinent surgical history.  Family History:  Family History  Problem Relation Age of Onset  . Anxiety disorder Mother   . Hypertension Father   . Heart attack Father   . Breast cancer Maternal Grandmother 60       has contact  . Prostate cancer Maternal Grandfather 60  . Diabetes Maternal Grandfather   . Heart failure Maternal Grandfather   . Kidney disease Maternal Grandfather     Social History:  Social History   Socioeconomic History  . Marital status: Married    Spouse name: Not on file  . Number of children: Not on file  . Years of education: Not on file  . Highest education level: Not on file  Occupational History  . Not on file  Tobacco Use  . Smoking status: Former Games developer  . Smokeless tobacco: Never Used  Substance and Sexual Activity  . Alcohol use: Not Currently    Comment: occ  . Drug use: Never  . Sexual activity: Yes    Birth control/protection: None  Other Topics Concern  . Not on file  Social History Narrative  . Not on file   Social Determinants of Health   Financial Resource Strain:   . Difficulty of Paying Living Expenses: Not on file  Food Insecurity:   . Worried About Programme researcher, broadcasting/film/video in the Last Year: Not  on file  . Ran Out of Food in the Last Year: Not on file  Transportation Needs:   . Lack of Transportation (Medical): Not on file  . Lack of Transportation (Non-Medical): Not on file  Physical Activity:   . Days of Exercise per Week: Not on file  . Minutes of Exercise per Session: Not on file  Stress:   . Feeling of Stress : Not on file  Social Connections:   . Frequency of Communication with Friends and Family: Not on file  . Frequency of Social Gatherings with Friends and Family: Not on file  . Attends Religious Services: Not on file  . Active Member of Clubs or Organizations: Not on file  . Attends Banker Meetings: Not on file  . Marital Status: Not on file  Intimate Partner Violence:   . Fear of Current or Ex-Partner: Not on file  . Emotionally Abused: Not on file  . Physically Abused: Not on file  . Sexually Abused: Not on file    Allergies:  Allergies  Allergen Reactions  . Amoxicillin Rash  . Cefaclor Hives and Rash  . Clarithromycin Rash  . Penicillins Hives and Rash  . Sulfamethoxazole-Trimethoprim Rash    Medications: Prior to Admission medications   Medication Sig Start Date End Date Taking? Authorizing Provider  Prenatal Vit-Fe Fumarate-FA (PRENATAL VITAMINS PO) Take by mouth.    [provider]    Physical Exam  Vitals: Last menstrual period 06/20/2019, unknown if currently breastfeeding. General: NAD HEENT: normocephalic, anicteric Pulmonary: No increased work of breathing Extremities: no edema, erythema, or tenderness Neurologic: Grossly intact Psychiatric: mood appropriate, affect full  Imaging US OB Comp Less 14 Wks  Result Date: 08/24/2019 Patient Name: Carol Steele DOB: [birth year] MRN: [id number] ULTRASOUND REPORT Location: [facility] Date of Service: 08/24/2019 Indications:Threatened AB Findings: Mason Jim intrauterine pregnancy is visualized with a CRL consistent with [redacted]w[redacted]d gestation, giving an (U/S) EDD of 04/06/2020.  The (U/S) EDD is not consistent with the clinically established EDD of 03/26/2020. Fetal heart beat is not seen. CRL measurement: 14.4 mm Yolk sac is is questionably visualized. Amnion: visualized and appears normal Right Ovary is normal in appearance. Left Ovary is normal appearance. Corpus luteal cyst:  Right ovary Survey of the adnexa demonstrates no adnexal masses. There is no free peritoneal fluid in the cul de sac. Impression: 1. [redacted]w[redacted]d Viable Singleton Intrauterine pregnancy by U/S. 2. (U/S) EDD is not consistent with Clinically established EDD of 03/26/2020. Recommendations: 1.Clinical correlation with the patient's History and Physical Exam. Deanna Artis, RT There is a nonviable singleton intrauterine gestation based on CRL of greater than 59mm without evidence of fetal cardiac activity "Society of Radiologyst in Ultrasound Guidelines for Transvaginal Ultrasonographic Diagnosis of Early Pregnancy Loss" and adopted in ACOG Practice Bulletin Number 150, May 2015 (reaffirmed 2017) "Early Pregnancy Loss" Vena Austria, MD, [facility], [facility] 08/24/2019, 4:48 PM    Assessment: 29 y.o. G2P0010 presenting for scheduled suction D&C for missed abortion  Plan: 1)  Condolences were offered to the patient and her family.  I stressed that while emotionally difficult, that this did not occur because of an actions or inactions by the patient.  Somewhere between 10-20% of identified first trimester pregnancies will unfortunately end in miscarriage.  Given this relatively high incidence rate, further diagnostic testing such as chromosome analysis is generally not clinically relevant nor recommended.  Although the chromosomal abnormalities have been implicated at rates as high as 70% in some studies, these are generally random and do not infer and increased risk of recurrence with subsequent pregnancies.  However, 3 or more consecutive first trimester losses are relatively uncommon,  and these patient generally do benefit from additional work up to determine a potential modifiable etiology.   We briefly discussed management options including expectant management, medical management, and surgical management as well as their relative success rates and complications. Approximately 80% of first trimester miscarriages will pass successfully but may require a time frame of up to 8 weeks (ACOG Practice Bulletin 150 May 2015 "Early Pregnancy Loss").  Medical management using of misoprostil administered every 3-hrs as needed for up to 3 doses speeds up the time frame to completion significantly, has literature supporting its use up to 63 days or [redacted]w[redacted]d gestation and results in a passage rate of 84-85% (ACOG Practice Bulletin 143 March 2014 "Medical Management of First-Trimester Abortion").  Dilation and curettage has the highest rate of uterine evacuation, but carries with is operative cost, surgical and anesthetic risk.  While these risk are relatively small they nevertheless include infection, bleeding, uterine perforation, formation of uterine synechia, and in rare cases death.  The patient is Rh positive and rhogam is therefore not indicated.    - patient and her husband have discussed management options and prefer to proceed with suction D&C  2) Routine postoperative instructions were reviewed with the patient and her family in detail today including  the expected length of recovery and likely postoperative course.  The patient concurred with the proposed plan, giving informed written consent for the surgery today.  Patient instructed on the importance of being NPO after midnight prior to her procedure.  If warranted preoperative prophylactic antibiotics and SCDs ordered on call to the OR to meet SCIP guidelines and adhere to recommendation laid forth in [place] Number 104 May 2009  "Antibiotic Prophylaxis for Gynecologic Procedures".     Malachy Mood, MD,  [facility], [facility] 08/24/2019, 4:51 PM

## 2019-08-25 ENCOUNTER — Encounter
Admission: RE | Admit: 2019-08-25 | Discharge: 2019-08-25 | Disposition: A | Discharge status: Home / Self Care | Payer: BC Managed Care – PPO | Source: Ambulatory Visit | Attending: Obstetrics and Gynecology | Admitting: Obstetrics and Gynecology

## 2019-08-25 ENCOUNTER — Other Ambulatory Visit
Admission: RE | Admit: 2019-08-25 | Discharge: 2019-08-25 | Disposition: A | Discharge status: Home / Self Care | Payer: BC Managed Care – PPO | Source: Ambulatory Visit | Attending: Obstetrics & Gynecology | Admitting: Obstetrics & Gynecology

## 2019-08-25 DIAGNOSIS — Z01812 Encounter for preprocedural laboratory examination: Secondary | ICD-10-CM | POA: Insufficient documentation

## 2019-08-25 DIAGNOSIS — Z20822 Contact with and (suspected) exposure to covid-19: Secondary | ICD-10-CM | POA: Insufficient documentation

## 2019-08-25 LAB — TYPE AND SCREEN
ABO/RH(D): A POS
Antibody Screen: NEGATIVE
Extend sample reason: UNDETERMINED

## 2019-08-25 LAB — CBC
Hematocrit: 38 % (ref 34.0–46.6)
Hemoglobin: 12.8 g/dL (ref 11.1–15.9)
MCH: 29.9 pg (ref 26.6–33.0)
MCHC: 33.7 g/dL (ref 31.5–35.7)
MCV: 89 fL (ref 79–97)
Platelets: 192 10*3/uL (ref 150–450)
RBC: 4.28 x10E6/uL (ref 3.77–5.28)
RDW: 12.5 % (ref 11.7–15.4)
WBC: 6.7 10*3/uL (ref 3.4–10.8)

## 2019-08-25 LAB — NOVEL CORONAVIRUS, NAA: SARS-CoV-2, NAA: NOT DETECTED

## 2019-08-25 LAB — SARS CORONAVIRUS 2 (TAT 6-24 HRS): SARS Coronavirus 2: NEGATIVE

## 2019-08-25 NOTE — Patient Instructions (Addendum)
Your procedure is scheduled on: Thursday 08/26/19.  Report to DAY SURGERY DEPARTMENT LOCATED ON 2ND FLOOR MEDICAL MALL ENTRANCE. To find out your arrival time please call [phone] between 1PM - 3PM TODAY.   Remember: Instructions that are not followed completely may result in serious medical risk, up to and including death, or upon the discretion of your surgeon and anesthesiologist your surgery may need to be rescheduled.      _X__ 1. Do not eat food after midnight the night before your procedure.                 No gum chewing or hard candies. You may drink clear liquids up to 2 hours                 before you are scheduled to arrive for your surgery- DO NOT drink clear                 liquids within 2 hours of the start of your surgery.                 Clear Liquids include:  water, apple juice without pulp, clear carbohydrate                 drink such as Clearfast or Gatorade, Black Coffee or Tea (Do not add                 anything to coffee or tea).   __X__2.  On the morning of surgery brush your teeth with toothpaste and water, you may rinse your mouth with mouthwash if you wish.  Do not swallow any toothpaste or mouthwash.      _X__ 3.  No Alcohol for 24 hours before or after surgery.    __X__6.  Notify your doctor if there is any change in your medical condition      (cold, fever, infections).      Do not wear jewelry, make-up, hairpins, clips or nail polish. Do not wear lotions, powders, or perfumes.  Do not shave 48 hours prior to surgery. Men may shave face and neck. Do not bring valuables to the hospital.    [facility] is not responsible for any belongings or valuables.   Contacts, dentures/partials or body piercings may not be worn into surgery. Bring a case for your contacts, glasses or hearing aids, a denture cup will be supplied.   Patients discharged the day of surgery will not be allowed to drive home.    __X__ Take these medicines the morning of  surgery with A SIP OF WATER:     1. NONE     __X__ Stop Anti-inflammatories 7 days before surgery such as Advil, Ibuprofen, Motrin, BC or Goodies Powder, Naprosyn, Naproxen, Aleve, Aspirin, Meloxicam. May take Tylenol if needed for pain or discomfort.

## 2019-08-26 ENCOUNTER — Ambulatory Visit
Admission: RE | Admit: 2019-08-26 | Discharge: 2019-08-26 | Disposition: A | Discharge status: Home / Self Care | Payer: BC Managed Care – PPO | Attending: Obstetrics and Gynecology | Admitting: Obstetrics and Gynecology

## 2019-08-26 ENCOUNTER — Ambulatory Visit: Payer: BC Managed Care – PPO | Admitting: Anesthesiology

## 2019-08-26 ENCOUNTER — Encounter: Payer: Self-pay | Admitting: Obstetrics and Gynecology

## 2019-08-26 ENCOUNTER — Other Ambulatory Visit: Payer: Self-pay

## 2019-08-26 ENCOUNTER — Encounter
Admission: RE | Disposition: A | Discharge status: Home / Self Care | Payer: Self-pay | Source: Home / Self Care | Attending: Obstetrics and Gynecology

## 2019-08-26 DIAGNOSIS — O021 Missed abortion: Secondary | ICD-10-CM | POA: Insufficient documentation

## 2019-08-26 DIAGNOSIS — Z87891 Personal history of nicotine dependence: Secondary | ICD-10-CM | POA: Diagnosis not present

## 2019-08-26 HISTORY — PX: DILATION AND EVACUATION: SHX1459

## 2019-08-26 LAB — ABO/RH: ABO/RH(D): A POS

## 2019-08-26 SURGERY — DILATION AND EVACUATION, UTERUS
Anesthesia: General | Site: "Vagina "

## 2019-08-26 MED ORDER — MIDAZOLAM HCL 2 MG/2ML IJ SOLN
INTRAMUSCULAR | Status: AC
  Filled 2019-08-26: qty 2

## 2019-08-26 MED ORDER — ONDANSETRON HCL 4 MG/2ML IJ SOLN
INTRAMUSCULAR | Status: AC
  Filled 2019-08-26: qty 2

## 2019-08-26 MED ORDER — FENTANYL CITRATE (PF) 100 MCG/2ML IJ SOLN
INTRAMUSCULAR | Status: AC
  Filled 2019-08-26: qty 2

## 2019-08-26 MED ORDER — ONDANSETRON HCL 4 MG/2ML IJ SOLN: 4.0000 mg | Freq: Once | INTRAMUSCULAR | Status: DC | PRN

## 2019-08-26 MED ORDER — FENTANYL CITRATE (PF) 100 MCG/2ML IJ SOLN
INTRAMUSCULAR | Status: DC | PRN
  Administered 2019-08-26: 25 ug via INTRAVENOUS
  Administered 2019-08-26: 50 ug via INTRAVENOUS
  Administered 2019-08-26: 25 ug via INTRAVENOUS

## 2019-08-26 MED ORDER — LIDOCAINE HCL (PF) 2 % IJ SOLN
INTRAMUSCULAR | Status: AC
  Filled 2019-08-26: qty 10

## 2019-08-26 MED ORDER — PROPOFOL 10 MG/ML IV BOLUS
INTRAVENOUS | Status: AC
  Filled 2019-08-26: qty 20

## 2019-08-26 MED ORDER — FAMOTIDINE 20 MG PO TABS
20.0000 mg | ORAL_TABLET | Freq: Once | ORAL | Status: AC
  Administered 2019-08-26: 13:00:00 20 mg via ORAL

## 2019-08-26 MED ORDER — DOXYCYCLINE HYCLATE 100 MG IV SOLR
200.0000 mg | INTRAVENOUS | Status: AC
  Administered 2019-08-26: 200 mg via INTRAVENOUS
  Filled 2019-08-26: qty 200

## 2019-08-26 MED ORDER — LIDOCAINE HCL (CARDIAC) PF 100 MG/5ML IV SOSY
PREFILLED_SYRINGE | INTRAVENOUS | Status: DC | PRN
  Administered 2019-08-26: 100 mg via INTRAVENOUS

## 2019-08-26 MED ORDER — OXYCODONE-ACETAMINOPHEN 5-325 MG PO TABS: 1.0000 | ORAL_TABLET | ORAL | 0 refills | Status: DC | PRN

## 2019-08-26 MED ORDER — IBUPROFEN 600 MG PO TABS: 600.0000 mg | ORAL_TABLET | Freq: Four times a day (QID) | ORAL | 3 refills | Status: DC | PRN

## 2019-08-26 MED ORDER — ONDANSETRON HCL 4 MG/2ML IJ SOLN
INTRAMUSCULAR | Status: DC | PRN
  Administered 2019-08-26: 4 mg via INTRAVENOUS

## 2019-08-26 MED ORDER — DEXAMETHASONE SODIUM PHOSPHATE 10 MG/ML IJ SOLN
INTRAMUSCULAR | Status: AC
  Filled 2019-08-26: qty 1

## 2019-08-26 MED ORDER — FAMOTIDINE 20 MG PO TABS
ORAL_TABLET | ORAL | Status: AC
  Filled 2019-08-26: qty 1

## 2019-08-26 MED ORDER — PROPOFOL 10 MG/ML IV BOLUS
INTRAVENOUS | Status: DC | PRN
  Administered 2019-08-26: 180 mg via INTRAVENOUS

## 2019-08-26 MED ORDER — FENTANYL CITRATE (PF) 100 MCG/2ML IJ SOLN: 25.0000 ug | INTRAMUSCULAR | Status: DC | PRN

## 2019-08-26 MED ORDER — PHENYLEPHRINE HCL (PRESSORS) 10 MG/ML IV SOLN
INTRAVENOUS | Status: DC | PRN
  Administered 2019-08-26: 100 ug via INTRAVENOUS

## 2019-08-26 MED ORDER — DEXAMETHASONE SODIUM PHOSPHATE 10 MG/ML IJ SOLN
INTRAMUSCULAR | Status: DC | PRN
  Administered 2019-08-26: 10 mg via INTRAVENOUS

## 2019-08-26 MED ORDER — KETOROLAC TROMETHAMINE 30 MG/ML IJ SOLN
INTRAMUSCULAR | Status: AC
  Filled 2019-08-26: qty 1

## 2019-08-26 MED ORDER — MIDAZOLAM HCL 2 MG/2ML IJ SOLN
INTRAMUSCULAR | Status: DC | PRN
  Administered 2019-08-26: 2 mg via INTRAVENOUS

## 2019-08-26 MED ORDER — LACTATED RINGERS IV SOLN: INTRAVENOUS | Status: DC

## 2019-08-26 SURGICAL SUPPLY — 20 items
CATH ROBINSON RED A/P 16FR (CATHETERS) ×4 IMPLANT
COVER WAND RF STERILE (DRAPES) ×4 IMPLANT
FILTER UTR ASPR SPEC (MISCELLANEOUS) ×2 IMPLANT
FLTR UTR ASPR SPEC (MISCELLANEOUS) ×3
GLOVE BIO SURGEON STRL SZ7 (GLOVE) ×4 IMPLANT
GOWN STRL REUS W/ TWL LRG LVL3 (GOWN DISPOSABLE) ×4 IMPLANT
GOWN STRL REUS W/TWL LRG LVL3 (GOWN DISPOSABLE) ×4
KIT BERKELEY 1ST TRIMESTER 3/8 (MISCELLANEOUS) ×4 IMPLANT
KIT TURNOVER CYSTO (KITS) ×4 IMPLANT
NS IRRIG 500ML POUR BTL (IV SOLUTION) ×4 IMPLANT
PACK DNC HYST (MISCELLANEOUS) ×4 IMPLANT
PAD OB MATERNITY 4.3X12.25 (PERSONAL CARE ITEMS) ×4 IMPLANT
PAD PREP 24X41 OB/GYN DISP (PERSONAL CARE ITEMS) ×4 IMPLANT
SET BERKELEY SUCTION TUBING (SUCTIONS) ×4 IMPLANT
TOWEL OR 17X26 4PK STRL BLUE (TOWEL DISPOSABLE) ×4 IMPLANT
VACURETTE 10 RIGID CVD (CANNULA) ×1 IMPLANT
VACURETTE 12 RIGID CVD (CANNULA) ×1 IMPLANT
VACURETTE 8 RIGID CVD (CANNULA) ×1 IMPLANT
VACURETTE 8MM F TIP (MISCELLANEOUS) ×7 IMPLANT
disposable flexible curette ×3 IMPLANT

## 2019-08-26 NOTE — Anesthesia Preprocedure Evaluation (Addendum)
Anesthesia Evaluation  Patient identified by MRN, date of birth, ID band Patient awake    Reviewed: Allergy & Precautions, NPO status , Patient's Chart, lab work & pertinent test results  Airway Mallampati: II  TM Distance: >3 FB     Dental  (+) Teeth Intact   Pulmonary neg pulmonary ROS, former smoker,    Pulmonary exam normal        Cardiovascular negative cardio ROS Normal cardiovascular exam     Neuro/Psych Anxiety negative neurological ROS     GI/Hepatic negative GI ROS, Neg liver ROS,   Endo/Other  negative endocrine ROS  Renal/GU negative Renal ROS  negative genitourinary   Musculoskeletal negative musculoskeletal ROS (+)   Abdominal Normal abdominal exam  (+)   Peds negative pediatric ROS (+)  Hematology negative hematology ROS (+)   Anesthesia Other Findings   Reproductive/Obstetrics                            Anesthesia Physical Anesthesia Plan  ASA: II  Anesthesia Plan: General   Post-op Pain Management:    Induction: Intravenous  PONV Risk Score and Plan:   Airway Management Planned: LMA  Additional Equipment:   Intra-op Plan:   Post-operative Plan: Extubation in OR  Informed Consent: I have reviewed the patients History and Physical, chart, labs and discussed the procedure including the risks, benefits and alternatives for the proposed anesthesia with the patient or authorized representative who has indicated his/her understanding and acceptance.     Dental advisory given  Plan Discussed with: CRNA and Surgeon  Anesthesia Plan Comments:         Anesthesia Quick Evaluation

## 2019-08-26 NOTE — H&P (Signed)
Date of Initial H&P:07/04/2019 History reviewed, patient examined, no change in status, stable for surgery. 

## 2019-08-26 NOTE — Anesthesia Procedure Notes (Signed)
Procedure Name: LMA Insertion Date/Time: 08/26/2019 4:06 PM Performed by: Ginger Carne, CRNA Pre-anesthesia Checklist: Patient identified, Emergency Drugs available, Suction available, Patient being monitored and Timeout performed Patient Re-evaluated:Patient Re-evaluated prior to induction Oxygen Delivery Method: Circle system utilized Preoxygenation: Pre-oxygenation with 100% oxygen Induction Type: IV induction LMA: LMA inserted LMA Size: 4.0 Tube type: Oral Number of attempts: 1 Placement Confirmation: ETT inserted through vocal cords under direct vision,  positive ETCO2 and breath sounds checked- equal and bilateral Tube secured with: Tape Dental Injury: Teeth and Oropharynx as per pre-operative assessment

## 2019-08-26 NOTE — Anesthesia Postprocedure Evaluation (Signed)
Anesthesia Post Note  Patient: Carol Steele  Procedure(s) Performed: SUCTION D&C (N/A Vagina )  Patient location during evaluation: PACU Anesthesia Type: General Level of consciousness: awake and alert Pain management: pain level controlled Vital Signs Assessment: post-procedure vital signs reviewed and stable Respiratory status: spontaneous breathing, nonlabored ventilation, respiratory function stable and patient connected to nasal cannula oxygen Cardiovascular status: blood pressure returned to baseline and stable Postop Assessment: no apparent nausea or vomiting Anesthetic complications: no     Last Vitals:  Vitals:   08/26/19 1658 08/26/19 1709  BP:  (!) 136/96  Pulse: (!) 104 95  Resp: (!) 25 17  Temp:    SpO2: 99% 100%    Last Pain:  Vitals:   08/26/19 1709  TempSrc:   PainSc: 0-No pain                 Cleda Mccreedy Khaleef Ruby

## 2019-08-26 NOTE — Op Note (Signed)
Preoperative Diagnosis: 1) 29 y.o. with 8 week missed abortion   Postoperative Diagnosis: 1) 29 y.o. with with 8 week missed abortion  Operation Performed: Suction dilation and curettage  Indication: 8 week missed abortion electing for surgical management  Anesthesia: .General  Primary Surgeon: Vena Austria, MD  Assistant: none  Preoperative Antibiotics: none  Estimated Blood Loss: 300 mL  IV Fluids:  Urine Output:: ~249mL straight cath  Drains or Tubes: none  Implants: none  Specimens Removed: Products of conception  Complications: none  Intraoperative Findings:  Cervix closed, uterus non-enlarged.  Moderate amount of POC evacuated.  Cervix and tenaculum sites hemostatic at conclusion of case  Patient Condition: stable  Procedure in Detail:  Patient was taken to the operating room were she was administered general endotracheal anesthesia.  She was positioned in the dorsal lithotomy position utilizing Allen stirups, prepped and draped in the usual sterile fashion.  Uterus was noted to be non-enlarged in size, anteverted.   Prior to proceeding with the case a time out was performed.  Attention was turned to the patient's pelvis.  A red rubber catheter was used to empty the patient's bladder.  An operative speculum was placed to allow visualization of the cervix.  The anterior lip of the cervix was grasped with a single tooth tenaculum and the cervix was sequentially dilated using pratt dilators.  A size 8 flexible suction curette was then advanced to the uterine fundus.  Several passes were undertaken to clear the uterine contents.  Sharp curettage was then  performed nothing good uterine cry throughout the cavity.  A final pass of the suction curette was then undertaken.  The resulting specimen was sent to pathology.    The single tooth tenaculum was removed from the cervix.  The tenaculum sites and cervix were noted to be  Hemostatic before removing the operative  speculum.  Sponge needle and instrument counts were corrects times two.  The patient tolerated the procedure well and was taken to the recovery room in stable condition.

## 2019-08-26 NOTE — Discharge Instructions (Signed)
Dilation and Curettage or Vacuum Curettage, Care After These instructions give you information about caring for yourself after your procedure. Your doctor may also give you more specific instructions. Call your doctor if you have any problems or questions after your procedure. Follow these instructions at home: Activity  Do not drive or use heavy machinery while taking prescription pain medicine.  For 24 hours after your procedure, avoid driving.  Take short walks often, followed by rest periods. Ask your doctor what activities are safe for you. After one or two days, you may be able to return to your normal activities.  Do not lift anything that is heavier than 10 lb (4.5 kg) until your doctor approves.  For at least 2 weeks, or as long as told by your doctor: ? Do not douche. ? Do not use tampons. ? Do not have sex. General instructions   Take over-the-counter and prescription medicines only as told by your doctor. This is very important if you take blood thinning medicine.  Do not take baths, swim, or use a hot tub until your doctor approves. Take showers instead of baths.  Wear compression stockings as told by your doctor.  It is up to you to get the results of your procedure. Ask your doctor when your results will be ready.  Keep all follow-up visits as told by your doctor. This is important. Contact a doctor if:  You have very bad cramps that get worse or do not get better with medicine.  You have very bad pain in your belly (abdomen).  You cannot drink fluids without throwing up (vomiting).  You get pain in a different part of the area between your belly and thighs (pelvis).  You have bad-smelling discharge from your vagina.  You have a rash. Get help right away if:  You are bleeding a lot from your vagina. A lot of bleeding means soaking more than one sanitary pad in an hour, for 2 hours in a row.  You have clumps of blood (blood clots) coming from your  vagina.  You have a fever or chills.  Your belly feels very tender or hard.  You have chest pain.  You have trouble breathing.  You cough up blood.  You feel dizzy.  You feel light-headed.  You pass out (faint).  You have pain in your neck or shoulder area. Summary  Take short walks often, followed by rest periods. Ask your doctor what activities are safe for you. After one or two days, you may be able to return to your normal activities.  Do not lift anything that is heavier than 10 lb (4.5 kg) until your doctor approves.  Do not take baths, swim, or use a hot tub until your doctor approves. Take showers instead of baths.  Contact your doctor if you have any symptoms of infection, like bad-smelling discharge from your vagina. This information is not intended to replace advice given to you by your health care provider. Make sure you discuss any questions you have with your health care provider. Document Revised: 06/27/2017 Document Reviewed: 04/01/2016 Elsevier Patient Education  2020 [employer] Inc[employer]   AMBULATORY SURGERY  DISCHARGE INSTRUCTIONS   1) The drugs that you were given will stay in your system until tomorrow so for the next 24 hours you should not:  A) Drive an automobile B) Make any legal decisions C) Drink any alcoholic beverage   2) You may resume regular meals tomorrow.  Today it is better to start with   liquids and gradually work up to solid foods.  You may eat anything you prefer, but it is better to start with liquids, then soup and crackers, and gradually work up to solid foods.   3) Please notify your doctor immediately if you have any unusual bleeding, trouble breathing, redness and pain at the surgery site, drainage, fever, or pain not relieved by medication.    4) Additional Instructions:        Please contact your physician with any problems or Same Day Surgery at [phone], Monday through Friday 6 am to 4 pm, or  at  [facility] Main number at [phone]. 

## 2019-08-27 NOTE — Transfer of Care (Signed)
Immediate Anesthesia Transfer of Care Note  Patient: Carol Steele  Procedure(s) Performed: SUCTION D&C (N/A Vagina )  Patient Location: PACU  Anesthesia Type:General  Level of Consciousness: sedated  Airway & Oxygen Therapy: Patient Spontanous Breathing and Patient connected to nasal cannula oxygen  Post-op Assessment: Report given to RN and Post -op Vital signs reviewed and stable  Post vital signs: Reviewed and stable  Last Vitals:  Vitals Value Taken Time  BP 128/81 08/26/19 1737  Temp 37.2 C 08/26/19 1737  Pulse 98 08/26/19 1737  Resp 16 08/26/19 1737  SpO2 100 % 08/26/19 1737  Vitals shown include unvalidated device data.  Last Pain:  Vitals:   08/26/19 1737  TempSrc: Temporal  PainSc: 2          Complications: No apparent anesthesia complications

## 2019-08-30 ENCOUNTER — Encounter: Payer: Self-pay | Admitting: Obstetrics and Gynecology

## 2019-08-30 ENCOUNTER — Ambulatory Visit (INDEPENDENT_AMBULATORY_CARE_PROVIDER_SITE_OTHER): Payer: BC Managed Care – PPO | Admitting: Obstetrics and Gynecology

## 2019-08-30 ENCOUNTER — Other Ambulatory Visit: Payer: Self-pay

## 2019-08-30 VITALS — BP 122/66 | Wt 147.0 lb

## 2019-08-30 DIAGNOSIS — O021 Missed abortion: Secondary | ICD-10-CM

## 2019-08-30 DIAGNOSIS — Z4889 Encounter for other specified surgical aftercare: Secondary | ICD-10-CM

## 2019-08-30 LAB — SURGICAL PATHOLOGY

## 2019-08-30 NOTE — Progress Notes (Signed)
      Postoperative Follow-up Patient presents post op from suction D&C 1weeks ago for missed abortion.  Subjective: Patient reports marked improvement in her preop symptoms. Eating a regular diet without difficulty. The patient is not having any pain.  Activity: normal activities of daily living.  Objective: Blood pressure 122/66, weight 147 lb (66.7 kg), unknown if currently breastfeeding.  General: NAD, well nourished, appears stated age Pulmonary: no increased work of breathing Abdomen: soft, non-tender, non-distended, incision(s) D/C/I Neurologic: normal gait Psych: mood appropriate, affect full    Admission on 08/26/2019, Discharged on 08/26/2019  Component Date Value Ref Range Status  . ABO/RH(D) 08/26/2019    Final                   Value:A POS Performed at [facility], [address] [place], [place] [address]     Assessment: 29 y.o. s/p suction D&C stable  Plan: Patient has done well after surgery with no apparent complications.  I have discussed the post-operative course to date, and the expected progress moving forward.  The patient understands what complications to be concerned about.  I will see the patient in routine follow up, or sooner if needed.    Activity plan: No restriction.   Vena Austria, MD, [facility], [facility] 08/30/2019, 8:36 AM

## 2019-10-04 ENCOUNTER — Encounter: Payer: Self-pay | Admitting: Obstetrics and Gynecology

## 2019-10-04 ENCOUNTER — Ambulatory Visit (INDEPENDENT_AMBULATORY_CARE_PROVIDER_SITE_OTHER): Payer: BC Managed Care – PPO | Admitting: Obstetrics and Gynecology

## 2019-10-04 ENCOUNTER — Other Ambulatory Visit: Payer: Self-pay

## 2019-10-04 VITALS — BP 112/58 | HR 107 | Ht 65.0 in | Wt 146.0 lb

## 2019-10-04 DIAGNOSIS — Z4889 Encounter for other specified surgical aftercare: Secondary | ICD-10-CM

## 2019-10-04 NOTE — Progress Notes (Signed)
      Postoperative Follow-up Patient presents post op from suction D&C 6weeks ago for missed abortion.  Subjective: Patient reports marked improvement in her preop symptoms. Eating a regular diet without difficulty. The patient is not having any pain.  Activity: normal activities of daily living.  Objective: Blood pressure (!) 112/58, pulse (!) 107, height 5\' 5"  (1.651 m), weight 146 lb (66.2 kg), last menstrual period 09/24/2019, not currently breastfeeding.  General: NAD Pulmonary: no increased work of breathing GU: normal external female genitalia normal cervix, no CMT, uterus normal in shape and contour, no adnexal tenderness or masses Extremities: no edema Neurologic: normal gait   Admission on 08/26/2019, Discharged on 08/26/2019  Component Date Value Ref Range Status  . ABO/RH(D) 08/26/2019    Final                   Value:A POS Performed at [facility], [address] [place], [place]   . SURGICAL PATHOLOGY 08/26/2019    Final-Edited                   Value:SURGICAL PATHOLOGY CASE: [id number] PATIENT: [facility] Surgical Pathology Report     Specimen Submitted: A. Products of conception  Clinical History: Missed abortion    DIAGNOSIS: A.  DILATATION AND CURETTAGE: - CHORIONIC VILLI AND DECIDUALIZED STROMA, COMPATIBLE WITH PRODUCTS OF CONCEPTION. - NO SOMATIC FETAL PARTS IDENTIFIED. - NO EVIDENCE OF GESTATIONAL TROPHOBLASTIC DISEASE.  GROSS DESCRIPTION: A. Labeled: Products of conception Received: In formalin Tissue fragment(s): Multiple Size: Aggregate, 6 x 6 x 3 cm Villous tissue: Identified Fetal tissue: Not present  Block summary: 1 -representative sections   Final Diagnosis performed by [facility], MD.   Electronically signed 08/30/2019 10:58:10AM The electronic signature indicates that the named Attending Pathologist has evaluated the specimen Technical component performed at [place], [address]  [place], [place] Lab: [phone] Dir: [phone], MD, MMM  Professional componen                         t performed at [facility], [facility], [address] [place], [place], [place] Lab: [phone] Dir: [phone]. Rubinas, MD     Assessment: 29 y.o. s/p suction D&C stable  Plan: Patient has done well after surgery with no apparent complications.  I have discussed the post-operative course to date, and the expected progress moving forward.  The patient understands what complications to be concerned about.  I will see the patient in routine follow up, or sooner if needed.    Activity plan: No restriction.   26, MD, Vena Austria [facility], [facility] 10/04/2019, 6:34 PM

## 2020-03-24 NOTE — Telephone Encounter (Signed)
Patient is scheduled for 03/27/20 with AMS at 3:50. Per patient's preference

## 2020-03-27 ENCOUNTER — Other Ambulatory Visit: Payer: Self-pay

## 2020-03-27 ENCOUNTER — Ambulatory Visit (INDEPENDENT_AMBULATORY_CARE_PROVIDER_SITE_OTHER): Payer: BC Managed Care – PPO | Admitting: Obstetrics and Gynecology

## 2020-03-27 ENCOUNTER — Encounter: Payer: Self-pay | Admitting: Obstetrics and Gynecology

## 2020-03-27 DIAGNOSIS — F411 Generalized anxiety disorder: Secondary | ICD-10-CM | POA: Diagnosis not present

## 2020-03-27 MED ORDER — ESCITALOPRAM OXALATE 10 MG PO TABS: 10.0000 mg | ORAL_TABLET | Freq: Every day | ORAL | 2 refills | Status: DC

## 2020-03-27 MED ORDER — HYDROXYZINE HCL 25 MG PO TABS: 25.0000 mg | ORAL_TABLET | Freq: Four times a day (QID) | ORAL | 2 refills | Status: DC | PRN

## 2020-03-27 NOTE — Progress Notes (Signed)
I connected with Carol Steele  on 03/27/20 at  3:50 PM EDT by telephone and verified that I am speaking with the correct person using two identifiers.   I discussed the limitations, risks, security and privacy concerns of performing an evaluation and management service by telephone and the availability of in person appointments. I also discussed with the patient that there may be a patient responsible charge related to this service. The patient expressed understanding and agreed to proceed.  The patient was at home I spoke with the patient from my workstation phone The names of people involved in this encounter were: Carol Steele , and Vena Austria   Obstetrics & Gynecology Office Visit   Chief Complaint:  Chief Complaint  Patient presents with  . Follow-up    Stress and anxiety, recent miscarriage    History of Present Illness: The patient is a 29 y.o. female presenting initial evaluation for symptoms of anxiety.  The patient is currently taking nothing for the management of her symptoms.  She has had recent situational stressors, (pandemic, still dealing with miscarriage earlier this year).  She reports symptoms of anhedonia, insomnia, irritability and social anxiety.  She denies risk taking behavior, agorophobia, feelings of guilt, feelings of worthlessness, suicidal ideation, homicidal ideation, auditory hallucinations and visual hallucinations. Symptoms have worsened since last visit.     The patient does not have a pre-existing history of depression and anxiety.  She  does not a prior history of suicide attempts.    Review of Systems: Review of Systems  Constitutional: Negative.   Gastrointestinal: Negative for nausea.  Neurological: Negative for headaches.  Psychiatric/Behavioral: Negative for depression, hallucinations, memory loss, substance abuse and suicidal ideas. The patient is nervous/anxious and has insomnia.     Past Medical History:  Past Medical History:    Diagnosis Date  . Anxiety 2020    Past Surgical History:  Past Surgical History:  Procedure Laterality Date  . DILATION AND EVACUATION N/A 08/26/2019   Procedure: SUCTION D&C;  Surgeon: Vena Austria, MD;  Location: [facility] ORS;  Service: Gynecology;  Laterality: N/A;    Gynecologic History: Patient's last menstrual period was 03/23/2020.  Obstetric History: G2P0020  Family History:  Family History  Problem Relation Age of Onset  . Anxiety disorder Mother   . Hypertension Father   . Heart attack Father   . Breast cancer Maternal Grandmother 60       has contact  . Prostate cancer Maternal Grandfather 60  . Diabetes Maternal Grandfather   . Heart failure Maternal Grandfather   . Kidney disease Maternal Grandfather     Social History:  Social History   Socioeconomic History  . Marital status: Married    Spouse name: Not on file  . Number of children: Not on file  . Years of education: Not on file  . Highest education level: Not on file  Occupational History  . Not on file  Tobacco Use  . Smoking status: Former Games developer  . Smokeless tobacco: Never Used  Vaping Use  . Vaping Use: Never used  Substance and Sexual Activity  . Alcohol use: Not Currently    Comment: occ  . Drug use: Never  . Sexual activity: Yes    Birth control/protection: None  Other Topics Concern  . Not on file  Social History Narrative  . Not on file   Social Determinants of Health   Financial Resource Strain:   . Difficulty of Paying Living Expenses: Not on  file  Food Insecurity:   . Worried About Programme researcher, broadcasting/film/video in the Last Year: Not on file  . Ran Out of Food in the Last Year: Not on file  Transportation Needs:   . Lack of Transportation (Medical): Not on file  . Lack of Transportation (Non-Medical): Not on file  Physical Activity:   . Days of Exercise per Week: Not on file  . Minutes of Exercise per Session: Not on file  Stress:   . Feeling of Stress : Not on file  Social  Connections:   . Frequency of Communication with Friends and Family: Not on file  . Frequency of Social Gatherings with Friends and Family: Not on file  . Attends Religious Services: Not on file  . Active Member of Clubs or Organizations: Not on file  . Attends Banker Meetings: Not on file  . Marital Status: Not on file  Intimate Partner Violence:   . Fear of Current or Ex-Partner: Not on file  . Emotionally Abused: Not on file  . Physically Abused: Not on file  . Sexually Abused: Not on file    Allergies:  Allergies  Allergen Reactions  . Amoxicillin Rash  . Cefaclor Hives and Rash  . Clarithromycin Rash  . Penicillins Hives and Rash  . Sulfamethoxazole-Trimethoprim Rash    Medications: Prior to Admission medications   Not on File    Physical Exam Vitals: There were no vitals filed for this visit. Patient's last menstrual period was 03/23/2020.  No physical exam as this was a remote telephone visit to promote social distancing during the current COVID-19 Pandemic   GAD 7 : Generalized Anxiety Score 03/27/2020  Nervous, Anxious, on Edge 2  Control/stop worrying 2  Worry too much - different things 2  Trouble relaxing 2  Restless 1  Easily annoyed or irritable 1  Afraid - awful might happen 2  Total GAD 7 Score 12  Anxiety Difficulty Somewhat difficult    No flowsheet data found.  No flowsheet data found.   Assessment: 29 y.o. G2P0020 generalized anxiety  Plan: Problem List Items Addressed This Visit    None    Visit Diagnoses    GAD (generalized anxiety disorder)    -  Primary   Relevant Medications   escitalopram (LEXAPRO) 10 MG tablet   hydrOXYzine (ATARAX/VISTARIL) 25 MG tablet      1) Anxiety - start lexapro 10mg  - start vistaril 25mg  prn  2) Thyroid and B12 screen has not been obtained previously  3) Telephone time 19:106min  4) Return in about 2 weeks (around 04/10/2020) for medication follow up in person.    44m, MD, 04/12/2020 OB/GYN, [facility] 03/27/2020, 4:38 PM

## 2020-04-13 ENCOUNTER — Other Ambulatory Visit: Payer: Self-pay

## 2020-04-13 ENCOUNTER — Ambulatory Visit (INDEPENDENT_AMBULATORY_CARE_PROVIDER_SITE_OTHER): Payer: BC Managed Care – PPO | Admitting: Obstetrics and Gynecology

## 2020-04-13 ENCOUNTER — Encounter: Payer: Self-pay | Admitting: Obstetrics and Gynecology

## 2020-04-13 VITALS — BP 110/70 | Ht 65.0 in | Wt 145.0 lb

## 2020-04-13 DIAGNOSIS — F411 Generalized anxiety disorder: Secondary | ICD-10-CM

## 2020-04-13 MED ORDER — ESCITALOPRAM OXALATE 10 MG PO TABS: 10.0000 mg | ORAL_TABLET | Freq: Every day | ORAL | 11 refills | Status: DC

## 2020-04-13 NOTE — Progress Notes (Signed)
Obstetrics & Gynecology Office Visit   Chief Complaint:  Chief Complaint  Patient presents with  . Follow-up    History of Present Illness: The patient is a 29 y.o. female presenting follow up for symptoms of anxiety.  The patient is currently taking Lexparo 10mg  po daily and prn vistaril for the management of her symptoms.  She has not had any recent situational stressors.  She reports good improvement in symptoms.  She denies anhedonia, day time somnolence, insomnia, risk taking behavior, irritability, social anxiety, agorophobia, feelings of guilt, feelings of worthlessness, suicidal ideation, homicidal ideation, auditory hallucinations and visual hallucinations. Symptoms have improved since last visit.     The patient does not have a pre-existing history of depression and anxiety.  She  does not a prior history of suicide attempts.    Review of Systems: Review of Systems  Constitutional: Negative.   Gastrointestinal: Negative for nausea.  Neurological: Positive for headaches.  Psychiatric/Behavioral: Negative.      Past Medical History:  Past Medical History:  Diagnosis Date  . Anxiety 2020    Past Surgical History:  Past Surgical History:  Procedure Laterality Date  . DILATION AND EVACUATION N/A 08/26/2019   Procedure: SUCTION D&C;  Surgeon: 08/28/2019, MD;  Location: [facility] ORS;  Service: Gynecology;  Laterality: N/A;    Gynecologic History: Patient's last menstrual period was 03/23/2020.  Obstetric History: G2P0020  Family History:  Family History  Problem Relation Age of Onset  . Anxiety disorder Mother   . Hypertension Father   . Heart attack Father   . Breast cancer Maternal Grandmother 60       has contact  . Prostate cancer Maternal Grandfather 60  . Diabetes Maternal Grandfather   . Heart failure Maternal Grandfather   . Kidney disease Maternal Grandfather     Social History:  Social History   Socioeconomic History  . Marital status:  Married    Spouse name: Not on file  . Number of children: Not on file  . Years of education: Not on file  . Highest education level: Not on file  Occupational History  . Not on file  Tobacco Use  . Smoking status: Former 10-20-1979  . Smokeless tobacco: Never Used  Vaping Use  . Vaping Use: Never used  Substance and Sexual Activity  . Alcohol use: Not Currently    Comment: occ  . Drug use: Never  . Sexual activity: Yes    Birth control/protection: None  Other Topics Concern  . Not on file  Social History Narrative  . Not on file   Social Determinants of Health   Financial Resource Strain:   . Difficulty of Paying Living Expenses: Not on file  Food Insecurity:   . Worried About Games developer in the Last Year: Not on file  . Ran Out of Food in the Last Year: Not on file  Transportation Needs:   . Lack of Transportation (Medical): Not on file  . Lack of Transportation (Non-Medical): Not on file  Physical Activity:   . Days of Exercise per Week: Not on file  . Minutes of Exercise per Session: Not on file  Stress:   . Feeling of Stress : Not on file  Social Connections:   . Frequency of Communication with Friends and Family: Not on file  . Frequency of Social Gatherings with Friends and Family: Not on file  . Attends Religious Services: Not on file  . Active Member of Clubs  or Organizations: Not on file  . Attends Banker Meetings: Not on file  . Marital Status: Not on file  Intimate Partner Violence:   . Fear of Current or Ex-Partner: Not on file  . Emotionally Abused: Not on file  . Physically Abused: Not on file  . Sexually Abused: Not on file    Allergies:  Allergies  Allergen Reactions  . Amoxicillin Rash  . Cefaclor Hives and Rash  . Clarithromycin Rash  . Penicillins Hives and Rash  . Sulfamethoxazole-Trimethoprim Rash    Medications: Prior to Admission medications   Medication Sig Start Date End Date Taking? Authorizing Provider    escitalopram (LEXAPRO) 10 MG tablet Take 1 tablet (10 mg total) by mouth daily. 03/27/20 03/27/21 Yes Vena Austria, MD  hydrOXYzine (ATARAX/VISTARIL) 25 MG tablet Take 1 tablet (25 mg total) by mouth every 6 (six) hours as needed for anxiety. 03/27/20  Yes Vena Austria, MD    Physical Exam Vitals:  Vitals:   04/13/20 0823  BP: 110/70   Patient's last menstrual period was 03/23/2020.  General: NAD HEENT: normocephalic, anicteric Pulmonary: No increased work of breathing Neurologic: Grossly intact Psychiatric: mood appropriate, affect full    GAD 7 : Generalized Anxiety Score 03/27/2020  Nervous, Anxious, on Edge 2  Control/stop worrying 2  Worry too much - different things 2  Trouble relaxing 2  Restless 1  Easily annoyed or irritable 1  Afraid - awful might happen 2  Total GAD 7 Score 12  Anxiety Difficulty Somewhat difficult    No flowsheet data found.  No flowsheet data found.  Immunization History  Administered Date(s) Administered  . Influenza, Seasonal, Injecte, Preservative Fre 04/13/2018  . Influenza,inj,Quad PF,6+ Mos 04/12/2019  . PFIZER SARS-COV-2 Vaccination 03/27/2020    Assessment: 29 y.o. G2P0020 follow up generalized anxiety  Plan: Problem List Items Addressed This Visit    None    Visit Diagnoses    GAD (generalized anxiety disorder)    -  Primary   Relevant Orders   B12   CBC   Thyroid Panel With TSH      1) GAD - GAD-7 down to 2, PHQ-9, doing well no side-effects noted - continue lexapro at 10mg  po daily  2) Thyroid and B12 screen has not been obtained previously - CBC, B12, and Thyroid panel today  3) A total of 15 minutes were spent in face-to-face contact with the patient during this encounter with over half of that time devoted to counseling and coordination of care.  4) Return in about 3 months (around 07/13/2020) for medication follow up (phone or in person).    07/15/2020, MD, Vena Austria [facility], [facility] 04/13/2020, 8:28 AM

## 2020-04-14 LAB — CBC
Hematocrit: 39.9 % (ref 34.0–46.6)
Hemoglobin: 13.3 g/dL (ref 11.1–15.9)
MCH: 30 pg (ref 26.6–33.0)
MCHC: 33.3 g/dL (ref 31.5–35.7)
MCV: 90 fL (ref 79–97)
Platelets: 201 10*3/uL (ref 150–450)
RBC: 4.43 x10E6/uL (ref 3.77–5.28)
RDW: 12.6 % (ref 11.7–15.4)
WBC: 6.3 10*3/uL (ref 3.4–10.8)

## 2020-04-14 LAB — THYROID PANEL WITH TSH
Free Thyroxine Index: 1.8 (ref 1.2–4.9)
T3 Uptake Ratio: 27 % (ref 24–39)
T4, Total: 6.6 ug/dL (ref 4.5–12.0)
TSH: 4.38 u[IU]/mL (ref 0.450–4.500)

## 2020-04-14 LAB — VITAMIN B12: Vitamin B-12: 488 pg/mL (ref 232–1245)

## 2020-07-14 ENCOUNTER — Ambulatory Visit (INDEPENDENT_AMBULATORY_CARE_PROVIDER_SITE_OTHER): Payer: BC Managed Care – PPO | Admitting: Obstetrics and Gynecology

## 2020-07-14 ENCOUNTER — Encounter: Payer: Self-pay | Admitting: Obstetrics and Gynecology

## 2020-07-14 ENCOUNTER — Other Ambulatory Visit: Payer: Self-pay

## 2020-07-14 VITALS — BP 118/62 | Ht 65.0 in | Wt 147.0 lb

## 2020-07-14 DIAGNOSIS — N939 Abnormal uterine and vaginal bleeding, unspecified: Secondary | ICD-10-CM | POA: Diagnosis not present

## 2020-07-14 DIAGNOSIS — N926 Irregular menstruation, unspecified: Secondary | ICD-10-CM | POA: Diagnosis not present

## 2020-07-14 DIAGNOSIS — N970 Female infertility associated with anovulation: Secondary | ICD-10-CM

## 2020-07-14 DIAGNOSIS — F411 Generalized anxiety disorder: Secondary | ICD-10-CM

## 2020-07-14 NOTE — Addendum Note (Signed)
Addended by: Lorrene Reid on: 07/14/2020 09:13 AM   Modules accepted: Orders

## 2020-07-14 NOTE — Addendum Note (Signed)
Addended by: Lorrene Reid on: 07/14/2020 09:14 AM   Modules accepted: Orders

## 2020-07-14 NOTE — Patient Instructions (Signed)
[email or web address] 

## 2020-07-14 NOTE — Progress Notes (Signed)
Obstetrics & Gynecology Office Visit   Chief Complaint:  Chief Complaint  Patient presents with  . Follow-up    F/U medication - doing good, no concerns. RM 4    History of Present Illness: The patient is a 29 y.o. female presenting follow up for symptoms of anxiety.  The patient is currently taking Lexapro 10mg  for the management of her symptoms.  She has not had any recent situational stressors.  She reports continued good control of symptoms.  She denies anhedonia, day time somnolence, insomnia, risk taking behavior, irritability, social anxiety, agorophobia, feelings of guilt, feelings of worthlessness, suicidal ideation, homicidal ideation, auditory hallucinations and visual hallucinations. Symptoms have improved since last visit.     The patient does have a pre-existing history of depression and anxiety.  She  does not a prior history of suicide attempts.   No menses yet this month despite positive OPK at home.  Review of Systems: review of systems negative unless noted in HPI  Past Medical History:  Past Medical History:  Diagnosis Date  . Anxiety 2020    Past Surgical History:  Past Surgical History:  Procedure Laterality Date  . DILATION AND EVACUATION N/A 08/26/2019   Procedure: SUCTION D&C;  Surgeon: 08/28/2019, MD;  Location: [facility] ORS;  Service: Gynecology;  Laterality: N/A;    Gynecologic History: Patient's last menstrual period was 06/15/2020.  Obstetric History: G2P0020  Family History:  Family History  Problem Relation Age of Onset  . Anxiety disorder Mother   . Hypertension Father   . Heart attack Father   . Breast cancer Maternal Grandmother 60       has contact  . Prostate cancer Maternal Grandfather 60  . Diabetes Maternal Grandfather   . Heart failure Maternal Grandfather   . Kidney disease Maternal Grandfather     Social History:  Social History   Socioeconomic History  . Marital status: Married    Spouse name: Not on file  .  Number of children: Not on file  . Years of education: Not on file  . Highest education level: Not on file  Occupational History  . Not on file  Tobacco Use  . Smoking status: Former 10-20-1979  . Smokeless tobacco: Never Used  Vaping Use  . Vaping Use: Never used  Substance and Sexual Activity  . Alcohol use: Not Currently    Comment: occ  . Drug use: Never  . Sexual activity: Yes    Birth control/protection: None  Other Topics Concern  . Not on file  Social History Narrative  . Not on file   Social Determinants of Health   Financial Resource Strain: Not on file  Food Insecurity: Not on file  Transportation Needs: Not on file  Physical Activity: Not on file  Stress: Not on file  Social Connections: Not on file  Intimate Partner Violence: Not on file    Allergies:  Allergies  Allergen Reactions  . Amoxicillin Rash  . Cefaclor Hives and Rash  . Clarithromycin Rash  . Penicillins Hives and Rash  . Sulfamethoxazole-Trimethoprim Rash    Medications: Prior to Admission medications   Medication Sig Start Date End Date Taking? Authorizing Provider  escitalopram (LEXAPRO) 10 MG tablet Take 1 tablet (10 mg total) by mouth daily. 04/13/20 04/13/21 Yes 04/15/21, MD  hydrOXYzine (ATARAX/VISTARIL) 25 MG tablet Take 1 tablet (25 mg total) by mouth every 6 (six) hours as needed for anxiety. 03/27/20  Yes 03/29/20, MD  Physical Exam Vitals:  Vitals:   07/14/20 0845  BP: 118/62   Patient's last menstrual period was 06/15/2020.  General: NAD HEENT: normocephalic, anicteric Pulmonary: No increased work of breathing Neurologic: Grossly intact Psychiatric: mood appropriate, affect full    GAD 7 : Generalized Anxiety Score 07/14/2020 04/13/2020 03/27/2020  Nervous, Anxious, on Edge 0 1 2  Control/stop worrying 1 0 2  Worry too much - different things 0 0 2  Trouble relaxing 0 1 2  Restless 0 0 1  Easily annoyed or irritable 0 0 1  Afraid - awful might  happen 1 0 2  Total GAD 7 Score 2 2 12   Anxiety Difficulty Not difficult at all - Somewhat difficult    Depression screen [facility] 2/9 07/14/2020 04/13/2020  Decreased Interest 0 0  Down, Depressed, Hopeless 0 0  PHQ - 2 Score 0 0  Altered sleeping 1 1  Tired, decreased energy 0 1  Change in appetite 0 0  Feeling bad or failure about yourself  0 0  Trouble concentrating 0 0  Moving slowly or fidgety/restless 0 0  Suicidal thoughts 0 0  PHQ-9 Score 1 2  Difficult doing work/chores Somewhat difficult -    Depression screen [facility] 2/9 07/14/2020 04/13/2020  Decreased Interest 0 0  Down, Depressed, Hopeless 0 0  PHQ - 2 Score 0 0  Altered sleeping 1 1  Tired, decreased energy 0 1  Change in appetite 0 0  Feeling bad or failure about yourself  0 0  Trouble concentrating 0 0  Moving slowly or fidgety/restless 0 0  Suicidal thoughts 0 0  PHQ-9 Score 1 2  Difficult doing work/chores Somewhat difficult -     Assessment: 29 y.o. G2P0020 follow up anxiety, late menses  Plan: Problem List Items Addressed This Visit   None   Visit Diagnoses    Late menses    -  Primary   Relevant Orders   Beta hCG quant (ref lab)   Progesterone   Generalized anxiety disorder          1) generalized anxiety - continue lexapro 10mg   2) Thyroid and B12 screen has been obtained previously  3) Late menses - check HCG and progesterone level  4) Return in about 1 year (around 07/14/2021) for annual.    , MD, 07/16/2021 OB/GYN, [facility] 07/14/2020, 9:09 AM

## 2020-07-15 ENCOUNTER — Other Ambulatory Visit: Payer: Self-pay | Admitting: Obstetrics and Gynecology

## 2020-07-15 DIAGNOSIS — N979 Female infertility, unspecified: Secondary | ICD-10-CM

## 2020-07-15 LAB — PROGESTERONE: Progesterone: 0.7 ng/mL

## 2020-07-15 LAB — BETA HCG QUANT (REF LAB): hCG Quant: <1 m[IU]/mL

## 2020-07-15 NOTE — Progress Notes (Signed)
LMP 07/15/2020 will obtain midcycle ultrasound, day 21 progesterone on 08/04/2020

## 2020-07-18 ENCOUNTER — Telehealth: Payer: Self-pay | Admitting: Obstetrics and Gynecology

## 2020-07-29 NOTE — L&D Delivery Note (Signed)
Delivery Note At 8:00 PM a viable female was delivered via Vaginal, Spontaneous (Presentation: Right Occiput Anterior).  APGAR: 8, 9; weight pending.   Placenta status: Spontaneous, Intact.  Cord: 3 vessels with the following complications: None.  Cord pH: N/A  Anesthesia: Epidural Episiotomy: None Lacerations: 2nd degree;Perineal Suture Repair: 2.0 Vicryl and 3.0 Monocryl Est. Blood Loss (mL):   Mom to postpartum.  Baby to Couplet care / Skin to Skin.  Vena Austria 07/26/2021, 8:32 PM

## 2020-08-01 ENCOUNTER — Other Ambulatory Visit: Payer: Self-pay

## 2020-08-01 ENCOUNTER — Ambulatory Visit (INDEPENDENT_AMBULATORY_CARE_PROVIDER_SITE_OTHER): Payer: Self-pay

## 2020-08-01 DIAGNOSIS — N979 Female infertility, unspecified: Secondary | ICD-10-CM

## 2020-08-04 ENCOUNTER — Other Ambulatory Visit: Payer: BC Managed Care – PPO

## 2020-08-04 ENCOUNTER — Other Ambulatory Visit: Payer: Self-pay

## 2020-08-05 LAB — PROGESTERONE: Progesterone: 12.1 ng/mL

## 2020-11-06 ENCOUNTER — Ambulatory Visit: Payer: Self-pay | Admitting: Obstetrics and Gynecology

## 2020-11-20 ENCOUNTER — Other Ambulatory Visit: Payer: Self-pay

## 2020-11-20 ENCOUNTER — Ambulatory Visit (INDEPENDENT_AMBULATORY_CARE_PROVIDER_SITE_OTHER): Payer: 59 | Admitting: Obstetrics and Gynecology

## 2020-11-20 ENCOUNTER — Encounter: Payer: Self-pay | Admitting: Obstetrics and Gynecology

## 2020-11-20 VITALS — BP 122/80 | Ht 65.0 in | Wt 152.0 lb

## 2020-11-20 DIAGNOSIS — Z8759 Personal history of other complications of pregnancy, childbirth and the puerperium: Secondary | ICD-10-CM

## 2020-11-20 DIAGNOSIS — Z3201 Encounter for pregnancy test, result positive: Secondary | ICD-10-CM

## 2020-11-20 DIAGNOSIS — Z3169 Encounter for other general counseling and advice on procreation: Secondary | ICD-10-CM | POA: Diagnosis not present

## 2020-11-20 DIAGNOSIS — O3680X0 Pregnancy with inconclusive fetal viability, not applicable or unspecified: Secondary | ICD-10-CM | POA: Diagnosis not present

## 2020-11-20 LAB — POCT URINE PREGNANCY: Preg Test, Ur: POSITIVE — AB

## 2020-11-20 NOTE — Progress Notes (Signed)
Obstetrics & Gynecology Office Visit   Chief Complaint:  Chief Complaint  Patient presents with  . fertility    Discuss next steps in conceiving, took home test before appointment and it was positive. RM 5    History of Present Illness: 30 y.o. G3P0020 initially scheduled to discuss next step in attempting to conceive status post suction D&C 08/26/2019.  Patient reports Patient's last menstrual period was 10/26/2020. with positive home urine pregnancy today preceding visit.  Repeat urine pregnancy test here in clinic also positive.    Thus far no significant nausea.  Some mild menstrual like cramping no bleeding.     Review of Systems: Review of Systems  Constitutional: Negative.   Gastrointestinal: Negative.   Genitourinary: Negative.      Past Medical History:  Past Medical History:  Diagnosis Date  . Anxiety 2020    Past Surgical History:  Past Surgical History:  Procedure Laterality Date  . DILATION AND EVACUATION N/A 08/26/2019   Procedure: SUCTION D&C;  Surgeon: Vena Austria, MD;  Location: [facility] ORS;  Service: Gynecology;  Laterality: N/A;    Gynecologic History: Patient's last menstrual period was 10/26/2020.  Obstetric History: G2P0020  Family History:  Family History  Problem Relation Age of Onset  . Anxiety disorder Mother   . Hypertension Father   . Heart attack Father   . Breast cancer Maternal Grandmother 60       has contact  . Prostate cancer Maternal Grandfather 60  . Diabetes Maternal Grandfather   . Heart failure Maternal Grandfather   . Kidney disease Maternal Grandfather     Social History:  Social History   Socioeconomic History  . Marital status: Married    Spouse name: Not on file  . Number of children: Not on file  . Years of education: Not on file  . Highest education level: Not on file  Occupational History  . Not on file  Tobacco Use  . Smoking status: Former Games developer  . Smokeless tobacco: Never Used  Vaping Use  .  Vaping Use: Never used  Substance and Sexual Activity  . Alcohol use: Not Currently    Comment: occ  . Drug use: Never  . Sexual activity: Yes    Birth control/protection: None  Other Topics Concern  . Not on file  Social History Narrative  . Not on file   Social Determinants of Health   Financial Resource Strain: Not on file  Food Insecurity: Not on file  Transportation Needs: Not on file  Physical Activity: Not on file  Stress: Not on file  Social Connections: Not on file  Intimate Partner Violence: Not on file    Allergies:  Allergies  Allergen Reactions  . Amoxicillin Rash  . Cefaclor Hives and Rash  . Clarithromycin Rash  . Penicillins Hives and Rash  . Sulfamethoxazole-Trimethoprim Rash    Medications: Prior to Admission medications   Medication Sig Start Date End Date Taking? Authorizing Provider  escitalopram (LEXAPRO) 10 MG tablet Take 1 tablet (10 mg total) by mouth daily. 04/13/20 04/13/21 Yes Vena Austria, MD  hydrOXYzine (ATARAX/VISTARIL) 25 MG tablet Take 1 tablet (25 mg total) by mouth every 6 (six) hours as needed for anxiety. 03/27/20   Vena Austria, MD    Physical Exam Vitals:  Vitals:   11/20/20 1556  BP: 122/80   Patient's last menstrual period was 10/26/2020.  General: NAD HEENT: normocephalic, anicteric Pulmonary: No increased work of breathing Neurologic: Grossly intact Psychiatric: mood appropriate,  affect full  Female chaperone present for pelvic  portions of the physical exam  Assessment: 30 y.o. G3P0020 presenting with early pregnancy  Plan: Problem List Items Addressed This Visit   None   Visit Diagnoses    Pre-conception counseling    -  Primary   Relevant Orders   POCT urine pregnancy (Completed)   Positive urine pregnancy test       Relevant Orders   Beta hCG quant (ref lab)   Beta hCG quant (ref lab)   Progesterone   Miscarriage within last 12 months       Relevant Orders   Beta hCG quant (ref lab)   Beta  hCG quant (ref lab)   Progesterone   Pregnancy with inconclusive fetal viability, single or unspecified fetus       Relevant Orders   Beta hCG quant (ref lab)   Beta hCG quant (ref lab)   Progesterone   Pregnancy of unknown anatomic location       Relevant Orders   Beta hCG quant (ref lab)   Beta hCG quant (ref lab)   Progesterone     1) Early pregnancy  - will trend HCG, as lab is closed today will drawn tomorrow 4/26 with progesterone and repeat 4/28 - if appropriate rise will determine timing of MD dating scan - will enroll in [facility] program for first trimester - Is already taking PNV, given samples at today's visit as well  2) A total of 15 minutes were spent in face-to-face contact with the patient during this encounter with over half of that time devoted to counseling and coordination of care.  3) Return in about 1 day (around 11/21/2020) for labs 4/26 and 4/28.   Vena Austria, MD, [facility], [facility]

## 2020-11-21 ENCOUNTER — Other Ambulatory Visit: Payer: 59

## 2020-11-21 DIAGNOSIS — Z8759 Personal history of other complications of pregnancy, childbirth and the puerperium: Secondary | ICD-10-CM

## 2020-11-21 DIAGNOSIS — O3680X0 Pregnancy with inconclusive fetal viability, not applicable or unspecified: Secondary | ICD-10-CM

## 2020-11-21 DIAGNOSIS — Z3201 Encounter for pregnancy test, result positive: Secondary | ICD-10-CM

## 2020-11-22 LAB — BETA HCG QUANT (REF LAB): hCG Quant: 174 m[IU]/mL

## 2020-11-22 LAB — PROGESTERONE: Progesterone: 26.4 ng/mL

## 2020-11-22 NOTE — Progress Notes (Signed)
NOB visit with myself 5/4-5/6 time frame

## 2020-11-23 ENCOUNTER — Other Ambulatory Visit: Payer: Self-pay

## 2020-11-23 ENCOUNTER — Other Ambulatory Visit: Payer: 59

## 2020-11-23 DIAGNOSIS — Z3201 Encounter for pregnancy test, result positive: Secondary | ICD-10-CM

## 2020-11-23 DIAGNOSIS — Z8759 Personal history of other complications of pregnancy, childbirth and the puerperium: Secondary | ICD-10-CM

## 2020-11-23 DIAGNOSIS — O3680X0 Pregnancy with inconclusive fetal viability, not applicable or unspecified: Secondary | ICD-10-CM | POA: Diagnosis not present

## 2020-11-24 LAB — BETA HCG QUANT (REF LAB): hCG Quant: 575 m[IU]/mL

## 2020-12-01 ENCOUNTER — Other Ambulatory Visit: Payer: Self-pay

## 2020-12-01 ENCOUNTER — Encounter: Payer: Self-pay | Admitting: Obstetrics and Gynecology

## 2020-12-01 ENCOUNTER — Ambulatory Visit (INDEPENDENT_AMBULATORY_CARE_PROVIDER_SITE_OTHER): Payer: 59 | Admitting: Obstetrics and Gynecology

## 2020-12-01 ENCOUNTER — Other Ambulatory Visit (HOSPITAL_COMMUNITY)
Admission: RE | Admit: 2020-12-01 | Discharge: 2020-12-01 | Disposition: A | Discharge status: Home / Self Care | Payer: 59 | Source: Ambulatory Visit | Attending: Obstetrics and Gynecology | Admitting: Obstetrics and Gynecology

## 2020-12-01 VITALS — BP 118/68 | HR 121 | Wt 153.0 lb

## 2020-12-01 DIAGNOSIS — Z3400 Encounter for supervision of normal first pregnancy, unspecified trimester: Secondary | ICD-10-CM | POA: Insufficient documentation

## 2020-12-01 DIAGNOSIS — Z369 Encounter for antenatal screening, unspecified: Secondary | ICD-10-CM | POA: Insufficient documentation

## 2020-12-01 DIAGNOSIS — Z113 Encounter for screening for infections with a predominantly sexual mode of transmission: Secondary | ICD-10-CM | POA: Diagnosis not present

## 2020-12-01 DIAGNOSIS — Z3689 Encounter for other specified antenatal screening: Secondary | ICD-10-CM

## 2020-12-01 NOTE — Progress Notes (Signed)
New Obstetric Patient H&P    Chief Complaint: "Desires prenatal care"   History of Present Illness: Patient is a 30 y.o. [id number] Not Hispanic or Latino female, presents with amenorrhea and positive home pregnancy test. Patient's last menstrual period was 10/26/2020. and based on her  LMP, her EDD is Estimated Date of Delivery: 08/02/21 and her EGA is [redacted]w[redacted]d. Cycles are regular monthly. Her last pap smear was 02/10/2019 and was no abnormalities.    She had a urine pregnancy test which was positive 1 week(s)  ago. Her last menstrual period was normal. Since her LMP she claims she has experienced some mild nausea. She denies vaginal bleeding. Her past medical history is noncontributory.   Since her LMP, she admits to the use of tobacco products  no There are cats in the home in the home  no She admits close contact with children on a regular basis  no  She has had chicken pox in the past yes She has had Tuberculosis exposures, symptoms, or previously tested positive for TB   no Current or past history of domestic violence. no  Genetic Screening/Teratology Counseling: (Includes patient, baby's father, or anyone in either family with:)   1. Patient's age >/= 2 at [facility]  no 2. Thalassemia ([place], [place], Mediterranean, or Asian background): MCV<80  no 3. Neural tube defect (meningomyelocele, spina bifida, anencephaly)  no 4. Congenital heart defect  no  5. Down syndrome  no 6. Tay-Sachs (Jewish, [place]  no 7. Canavan's Disease  no 8. Sickle cell disease or trait (African)  no  9. Hemophilia or other blood disorders  no  10. Muscular dystrophy  no  11. Cystic fibrosis  no  12. Huntington's Chorea  no  13. Mental retardation/autism  no 14. Other inherited genetic or chromosomal disorder  no 15. Maternal metabolic disorder (DM, PKU, etc)  no 16. Patient or FOB with a child with a birth defect not listed above no  16a. Patient or FOB with a birth defect themselves no 17. Recurrent  pregnancy loss, or stillbirth  no  18. Any medications since LMP other than prenatal vitamins (include vitamins, supplements, OTC meds, drugs, alcohol)  no 19. Any other genetic/environmental exposure to discuss  no  Infection History:   1. Lives with someone with TB or TB exposed  no  2. Patient or partner has history of genital herpes  no 3. Rash or viral illness since LMP  no 4. History of STI (GC, CT, HPV, syphilis, HIV)  no 5. History of recent travel :  no  Other pertinent information:  no     Review of Systems:10 point review of systems negative unless otherwise noted in HPI  Past Medical History:  Patient Active Problem List   Diagnosis Date Noted  . Sphenoidal sinus polyp 08/09/2013    Past Surgical History:  Past Surgical History:  Procedure Laterality Date  . DILATION AND EVACUATION N/A 08/26/2019   Procedure: SUCTION D&C;  Surgeon: Vena Austria, MD;  Location: [facility] ORS;  Service: Gynecology;  Laterality: N/A;    Gynecologic History: Patient's last menstrual period was 10/26/2020.  Obstetric History: G3P0020  Family History:  Family History  Problem Relation Age of Onset  . Anxiety disorder Mother   . Hypertension Father   . Heart attack Father   . Breast cancer Maternal Grandmother 60       has contact  . Prostate cancer Maternal Grandfather 60  . Diabetes Maternal Grandfather   . Heart failure  Maternal Grandfather   . Kidney disease Maternal Grandfather     Social History:  Social History   Socioeconomic History  . Marital status: Married    Spouse name: Not on file  . Number of children: Not on file  . Years of education: Not on file  . Highest education level: Not on file  Occupational History  . Not on file  Tobacco Use  . Smoking status: Former Games developer  . Smokeless tobacco: Never Used  Vaping Use  . Vaping Use: Never used  Substance and Sexual Activity  . Alcohol use: Not Currently    Comment: occ  . Drug use: Never  . Sexual  activity: Yes    Birth control/protection: None  Other Topics Concern  . Not on file  Social History Narrative  . Not on file   Social Determinants of Health   Financial Resource Strain: Not on file  Food Insecurity: Not on file  Transportation Needs: Not on file  Physical Activity: Not on file  Stress: Not on file  Social Connections: Not on file  Intimate Partner Violence: Not on file    Allergies:  Allergies  Allergen Reactions  . Amoxicillin Rash  . Cefaclor Hives and Rash  . Clarithromycin Rash  . Penicillins Hives and Rash  . Sulfamethoxazole-Trimethoprim Rash    Medications: Prior to Admission medications   Medication Sig Start Date End Date Taking? Authorizing Provider  escitalopram (LEXAPRO) 10 MG tablet Take 1 tablet (10 mg total) by mouth daily. 04/13/20 04/13/21 Yes Vena Austria, MD  Prenatal Vit-Fe Fumarate-FA (MULTIVITAMIN-PRENATAL) 27-0.8 MG TABS tablet Take 1 tablet by mouth daily at 12 noon.   Yes [provider]  hydrOXYzine (ATARAX/VISTARIL) 25 MG tablet Take 1 tablet (25 mg total) by mouth every 6 (six) hours as needed for anxiety. 03/27/20   Vena Austria, MD    Physical Exam Vitals: Blood pressure 118/68, pulse (!) 121, weight 153 lb (69.4 kg), last menstrual period 10/26/2020.  General: NAD HEENT: normocephalic, anicteric Thyroid: no enlargement, no palpable nodules Pulmonary: No increased work of breathing, CTAB Cardiovascular: RRR, distal pulses 2+ Abdomen: NABS, soft, non-tender, non-distended.  Umbilicus without lesions.  No hepatomegaly, splenomegaly or masses palpable. No evidence of hernia  Genitourinary:  External: Normal external female genitalia.  Normal urethral meatus, normal  Bartholin's and Skene's glands.    Vagina: Normal vaginal mucosa, no evidence of prolapse.    Cervix: Grossly normal in appearance, no bleeding  Uterus:  Non-enlarged, mobile, normal contour.  No CMT  Adnexa: ovaries non-enlarged, no adnexal  masses  Rectal: deferred Extremities: no edema, erythema, or tenderness Neurologic: Grossly intact Psychiatric: mood appropriate, affect full  TVUS: early IUP, YS visualized, early fetal pole to small to measure no FHT  Assessment: 30 y.o. G3P0020 at [redacted]w[redacted]d presenting to initiate prenatal care  Plan: 1) Avoid alcoholic beverages. 2) Patient encouraged not to smoke.  3) Discontinue the use of all non-medicinal drugs and chemicals.  4) Take prenatal vitamins daily.  5) Nutrition, food safety (fish, cheese advisories, and high nitrite foods) and exercise discussed. 6) Hospital and practice style discussed with cross coverage system.  7) Genetic Screening, such as with 1st Trimester Screening, cell free fetal DNA, AFP testing, and Ultrasound, as well as with amniocentesis and CVS as appropriate, is discussed with patient. At the conclusion of today's visit patient requested genetic testing 8) Follow up ultrasound next week 9) Return in about 1 week (around 12/08/2020) for ROB Lester Platas.   Vena Austria, MD,  [facility], [facility] 12/01/2020, 3:05 PM

## 2020-12-02 LAB — RPR+RH+ABO+RUB AB+AB SCR+CB...
Antibody Screen: NEGATIVE
HIV Screen 4th Generation wRfx: NONREACTIVE
Hematocrit: 39.9 % (ref 34.0–46.6)
Hemoglobin: 13.4 g/dL (ref 11.1–15.9)
Hepatitis B Surface Ag: NEGATIVE
MCH: 29.6 pg (ref 26.6–33.0)
MCHC: 33.6 g/dL (ref 31.5–35.7)
MCV: 88 fL (ref 79–97)
Platelets: 215 10*3/uL (ref 150–450)
RBC: 4.52 x10E6/uL (ref 3.77–5.28)
RDW: 12.9 % (ref 11.7–15.4)
RPR Ser Ql: NONREACTIVE
Rh Factor: POSITIVE
Rubella Antibodies, IGG: 1.61 index (ref >0.99)
Varicella zoster IgG: 404 index (ref >165)
WBC: 6.2 10*3/uL (ref 3.4–10.8)

## 2020-12-04 LAB — URINE CULTURE

## 2020-12-05 LAB — CERVICOVAGINAL ANCILLARY ONLY
Chlamydia: NEGATIVE
Comment: NEGATIVE
Comment: NORMAL
Neisseria Gonorrhea: NEGATIVE

## 2020-12-13 ENCOUNTER — Other Ambulatory Visit: Payer: Self-pay

## 2020-12-13 ENCOUNTER — Ambulatory Visit (INDEPENDENT_AMBULATORY_CARE_PROVIDER_SITE_OTHER): Payer: 59 | Admitting: Obstetrics and Gynecology

## 2020-12-13 VITALS — BP 118/68 | Wt 154.0 lb

## 2020-12-13 DIAGNOSIS — Z3400 Encounter for supervision of normal first pregnancy, unspecified trimester: Secondary | ICD-10-CM

## 2020-12-13 DIAGNOSIS — Z3A01 Less than 8 weeks gestation of pregnancy: Secondary | ICD-10-CM

## 2020-12-13 LAB — POCT URINALYSIS DIPSTICK OB
Glucose, UA: NEGATIVE
POC,PROTEIN,UA: NEGATIVE

## 2020-12-13 NOTE — Progress Notes (Signed)
Routine Prenatal Care Visit  Subjective  Carol Steele is a 30 y.o. G3P0020 at [redacted]w[redacted]d being seen today for ongoing prenatal care.  She is currently monitored for the following issues for this low-risk pregnancy and has Sphenoidal sinus polyp on their problem list.  ----------------------------------------------------------------------------------- Patient reports no complaints.   Contractions: Not present. Vag. Bleeding: None.  Movement: Absent. Denies leaking of fluid.  ----------------------------------------------------------------------------------- The following portions of the patient's history were reviewed and updated as appropriate: allergies, current medications, past family history, past medical history, past social history, past surgical history and problem list. Problem list updated.   Objective  Blood pressure 118/68, weight 154 lb (69.9 kg), last menstrual period 10/26/2020. Pregravid weight 150 lb (68 kg) Total Weight Gain 4 lb (1.814 kg)  Body mass index is 25.63 kg/m.  Urinalysis:      Fetal Status: Fetal Heart Rate (bpm): 157   Movement: Absent     General:  Alert, oriented and cooperative. Patient is in no acute distress.  Skin: Skin is warm and dry. No rash noted.   Cardiovascular: Normal heart rate noted  Respiratory: Normal respiratory effort, no problems with respiration noted  Abdomen: Soft, gravid, appropriate for gestational age.       Pelvic:  Cervical exam deferred        Extremities: Normal range of motion.     ental Status: Normal mood and affect. Normal behavior. Normal judgment and thought content.   Dating Scan  Singleton viable IUP with CRL 1.02cm (0.95cm, 1.05cm, 1.06cm) consistent with [redacted]w[redacted]d and EDD of 07/30/2021.  This is consistent with LMP dating EDD of 08/02/21  FHT 157BPM YS 0.45cm ROV normal, corpus luteum cyst 4.27 x 3.98cm LOV normal No uterine fibroids No free fluid  There is a viable singleton gestation.  The fetal biometry  correlates with established dating. Detailed evaluation of the fetal anatomy is precluded by early gestational age.  It must be noted that a normal ultrasound particular at this early gestational age is unable to rule out fetal aneuploidy, risk of first trimester miscarriage, or anatomic birth defects.  Assessment   30 y.o. G3P0020 at [redacted]w[redacted]d by  08/02/2021, by Last Menstrual Period presenting for routine prenatal visit  Plan   Pregnancy 3 Problems (from 12/01/20 to present)    Problem Noted Resolved   Supervision of normal pregnancy 12/16/2020 by Vena Austria, MD No   Overview Signed 12/16/2020  8:12 PM by Vena Austria, MD    Clinic [facility] Prenatal Labs  Dating LMP = 7 week [place] Blood type: A/Positive/-- (05/06 1544)   Genetic Screen 1 Screen:    AFP:     Quad:     NIPS: Antibody:Negative (05/06 1544)  Anatomic [place]  Rubella: 1.61 (05/06 1544) Varicella: Immune  GTT Early:               Third trimester:  RPR: Non Reactive (05/06 1544)   Rhogam N/A HBsAg: Negative (05/06 1544)   TDaP vaccine                       Flu Shot: HIV: Non Reactive (05/06 1544)   Baby Food                                GBS:   Contraception  Pap:02/10/2019 NILM  CBB     CS/VBAC N/A   Support Person Husband Ivin Booty  Gestational age appropriate obstetric precautions including but not limited to vaginal bleeding, contractions, leaking of fluid and fetal movement were reviewed in detail with the patient.    - S=D on ultrasound  Return in about 3 weeks (around 01/03/2021) for ROB genetcis.  Vena Austria, MD, [facility], [facility]

## 2020-12-16 DIAGNOSIS — Z3490 Encounter for supervision of normal pregnancy, unspecified, unspecified trimester: Secondary | ICD-10-CM | POA: Insufficient documentation

## 2020-12-20 ENCOUNTER — Encounter: Payer: 59 | Admitting: Obstetrics and Gynecology

## 2021-01-05 ENCOUNTER — Ambulatory Visit (INDEPENDENT_AMBULATORY_CARE_PROVIDER_SITE_OTHER): Payer: 59 | Admitting: Obstetrics and Gynecology

## 2021-01-05 ENCOUNTER — Other Ambulatory Visit: Payer: Self-pay

## 2021-01-05 VITALS — BP 110/68 | Wt 155.0 lb

## 2021-01-05 DIAGNOSIS — Z3400 Encounter for supervision of normal first pregnancy, unspecified trimester: Secondary | ICD-10-CM

## 2021-01-05 DIAGNOSIS — Z1379 Encounter for other screening for genetic and chromosomal anomalies: Secondary | ICD-10-CM

## 2021-01-05 DIAGNOSIS — Z31438 Encounter for other genetic testing of female for procreative management: Secondary | ICD-10-CM | POA: Diagnosis not present

## 2021-01-05 DIAGNOSIS — Z369 Encounter for antenatal screening, unspecified: Secondary | ICD-10-CM

## 2021-01-05 NOTE — Progress Notes (Signed)
ROB - no concerns. RM 4 °

## 2021-01-05 NOTE — Progress Notes (Signed)
    Routine Prenatal Care Visit  Subjective  CHAZLYN CUDE is a 30 y.o. G3P0020 at [redacted]w[redacted]d being seen today for ongoing prenatal care.  She is currently monitored for the following issues for this low-risk pregnancy and has Sphenoidal sinus polyp and Supervision of normal pregnancy on their problem list.  ----------------------------------------------------------------------------------- Patient reports no complaints.   Contractions: Not present. Vag. Bleeding: None.  Movement: Absent. Denies leaking of fluid.  ----------------------------------------------------------------------------------- The following portions of the patient's history were reviewed and updated as appropriate: allergies, current medications, past family history, past medical history, past social history, past surgical history and problem list. Problem list updated.   Objective  Blood pressure 110/68, weight 155 lb (70.3 kg), last menstrual period 10/26/2020. Pregravid weight 150 lb (68 kg) Total Weight Gain 5 lb (2.268 kg) Urinalysis:      Fetal Status: Fetal Heart Rate (bpm): 177   Movement: Absent     General:  Alert, oriented and cooperative. Patient is in no acute distress.  Skin: Skin is warm and dry. No rash noted.   Cardiovascular: Normal heart rate noted  Respiratory: Normal respiratory effort, no problems with respiration noted  Abdomen: Soft, gravid, appropriate for gestational age. Pain/Pressure: Absent     Pelvic:  Cervical exam deferred        Extremities: Normal range of motion.     ental Status: Normal mood and affect. Normal behavior. Normal judgment and thought content.     Assessment   30 y.o. G3P0020 at [redacted]w[redacted]d by  08/02/2021, by Last Menstrual Period presenting for routine prenatal visit  Plan   Pregnancy 3 Problems (from 12/01/20 to present)     Problem Noted Resolved   Supervision of normal pregnancy 12/16/2020 by Vena Austria, MD No   Overview Signed 12/16/2020  8:12 PM by Vena Austria, MD    Clinic [facility] Prenatal Labs  Dating LMP = 7 week [place] Blood type: A/Positive/-- (05/06 1544)   Genetic Screen 1 Screen:    AFP:     Quad:     NIPS: Antibody:Negative (05/06 1544)  Anatomic [place]  Rubella: 1.61 (05/06 1544) Varicella: Immune  GTT Early:               Third trimester:  RPR: Non Reactive (05/06 1544)   Rhogam N/A HBsAg: Negative (05/06 1544)   TDaP vaccine                       Flu Shot: HIV: Non Reactive (05/06 1544)   Baby Food                                GBS:   Contraception  Pap:02/10/2019 NILM  CBB     CS/VBAC    Support Person                  Gestational age appropriate obstetric precautions including but not limited to vaginal bleeding, contractions, leaking of fluid and fetal movement were reviewed in detail with the patient.    1) Inheritest and MaterniT21 obtained today  Return in about 2 weeks (around 01/19/2021) for ROB .  Vena Austria, MD, [facility], [facility] 01/05/2021, 10:24 AM

## 2021-01-10 ENCOUNTER — Encounter: Payer: 59 | Admitting: Obstetrics and Gynecology

## 2021-01-12 LAB — MATERNIT 21 PLUS CORE, BLOOD
Fetal Fraction: 9
Result (T21): NEGATIVE
Trisomy 13 (Patau syndrome): NEGATIVE
Trisomy 18 (Edwards syndrome): NEGATIVE
Trisomy 21 (Down syndrome): NEGATIVE

## 2021-01-15 LAB — INHERITEST CORE(CF97,SMA,FRAX)

## 2021-01-17 ENCOUNTER — Other Ambulatory Visit: Payer: Self-pay

## 2021-01-17 ENCOUNTER — Ambulatory Visit (INDEPENDENT_AMBULATORY_CARE_PROVIDER_SITE_OTHER): Payer: 59 | Admitting: Obstetrics and Gynecology

## 2021-01-17 VITALS — BP 121/83 | Wt 156.0 lb

## 2021-01-17 DIAGNOSIS — Z3400 Encounter for supervision of normal first pregnancy, unspecified trimester: Secondary | ICD-10-CM

## 2021-01-17 DIAGNOSIS — Z3A11 11 weeks gestation of pregnancy: Secondary | ICD-10-CM

## 2021-01-17 DIAGNOSIS — Z363 Encounter for antenatal screening for malformations: Secondary | ICD-10-CM

## 2021-01-17 LAB — POCT URINALYSIS DIPSTICK OB
Glucose, UA: NEGATIVE
POC,PROTEIN,UA: NEGATIVE

## 2021-01-17 NOTE — Progress Notes (Signed)
ROB - no concerns. RM 2 

## 2021-01-17 NOTE — Progress Notes (Signed)
    Routine Prenatal Care Visit  Subjective  Carol Steele is a 30 y.o. G3P0020 at [redacted]w[redacted]d being seen today for ongoing prenatal care.  She is currently monitored for the following issues for this low-risk pregnancy and has Sphenoidal sinus polyp and Supervision of normal pregnancy on their problem list.  ----------------------------------------------------------------------------------- Patient reports no complaints.   Contractions: Not present. Vag. Bleeding: None.  Movement: Absent. Denies leaking of fluid.  ----------------------------------------------------------------------------------- The following portions of the patient's history were reviewed and updated as appropriate: allergies, current medications, past family history, past medical history, past social history, past surgical history and problem list. Problem list updated.   Objective  Blood pressure 121/83, weight 156 lb (70.8 kg), last menstrual period 10/26/2020. Pregravid weight 150 lb (68 kg) Total Weight Gain 6 lb (2.722 kg) Urinalysis:      Fetal Status: Fetal Heart Rate (bpm): 160   Movement: Absent     General:  Alert, oriented and cooperative. Patient is in no acute distress.  Skin: Skin is warm and dry. No rash noted.   Cardiovascular: Normal heart rate noted  Respiratory: Normal respiratory effort, no problems with respiration noted  Abdomen: Soft, gravid, appropriate for gestational age. Pain/Pressure: Absent     Pelvic:  Cervical exam deferred        Extremities: Normal range of motion.     ental Status: Normal mood and affect. Normal behavior. Normal judgment and thought content.     Assessment   30 y.o. G3P0020 at [redacted]w[redacted]d by  08/02/2021, by Last Menstrual Period presenting for routine prenatal visit  Plan   Pregnancy 3 Problems (from 12/01/20 to present)     Problem Noted Resolved   Supervision of normal pregnancy 12/16/2020 by Vena Austria, MD No   Overview Signed 12/16/2020  8:12 PM by Vena Austria, MD    Clinic [facility] Prenatal Labs  Dating LMP = 7 week [place] Blood type: A/Positive/-- (05/06 1544)   Genetic Screen 1 Screen:    AFP:     Quad:     NIPS: Antibody:Negative (05/06 1544)  Anatomic [place]  Rubella: 1.61 (05/06 1544) Varicella: Immune  GTT Early:               Third trimester:  RPR: Non Reactive (05/06 1544)   Rhogam N/A HBsAg: Negative (05/06 1544)   TDaP vaccine                       Flu Shot: HIV: Non Reactive (05/06 1544)   Baby Food                                GBS:   Contraception  Pap:02/10/2019 NILM  CBB     CS/VBAC    Support Person                  Gestational age appropriate obstetric precautions including but not limited to vaginal bleeding, contractions, leaking of fluid and fetal movement were reviewed in detail with the patient.    - anatomy scan ordered  Return in about 3 weeks (around 02/07/2021) for ROB.  Vena Austria, MD, [facility], [facility] 01/17/2021, 4:15 PM

## 2021-02-07 ENCOUNTER — Ambulatory Visit (INDEPENDENT_AMBULATORY_CARE_PROVIDER_SITE_OTHER): Payer: 59 | Admitting: Obstetrics and Gynecology

## 2021-02-07 ENCOUNTER — Other Ambulatory Visit: Payer: Self-pay

## 2021-02-07 VITALS — BP 111/70 | Wt 158.0 lb

## 2021-02-07 DIAGNOSIS — Z3A14 14 weeks gestation of pregnancy: Secondary | ICD-10-CM

## 2021-02-07 DIAGNOSIS — Z3400 Encounter for supervision of normal first pregnancy, unspecified trimester: Secondary | ICD-10-CM

## 2021-02-07 DIAGNOSIS — Z8669 Personal history of other diseases of the nervous system and sense organs: Secondary | ICD-10-CM

## 2021-02-07 DIAGNOSIS — Z8759 Personal history of other complications of pregnancy, childbirth and the puerperium: Secondary | ICD-10-CM

## 2021-02-07 LAB — POCT URINALYSIS DIPSTICK OB
Glucose, UA: NEGATIVE
POC,PROTEIN,UA: NEGATIVE

## 2021-02-07 MED ORDER — PROCHLORPERAZINE MALEATE 10 MG PO TABS: 10.0000 mg | ORAL_TABLET | Freq: Four times a day (QID) | ORAL | 0 refills | Status: DC | PRN

## 2021-02-07 NOTE — Progress Notes (Signed)
ROB - no concerns. RM 2 

## 2021-02-07 NOTE — Progress Notes (Signed)
    Routine Prenatal Care Visit  Subjective  Carol Steele is a 30 y.o. G3P0020 at [redacted]w[redacted]d being seen today for ongoing prenatal care.  She is currently monitored for the following issues for this low-risk pregnancy and has Sphenoidal sinus polyp and Supervision of normal pregnancy on their problem list.  ----------------------------------------------------------------------------------- Patient reports no complaints.  Has had some migraines Contractions: Not present. Vag. Bleeding: None.  Movement: Absent. Denies leaking of fluid.  ----------------------------------------------------------------------------------- The following portions of the patient's history were reviewed and updated as appropriate: allergies, current medications, past family history, past medical history, past social history, past surgical history and problem list. Problem list updated.   Objective  Blood pressure 111/70, weight 158 lb (71.7 kg), last menstrual period 10/26/2020. Pregravid weight 150 lb (68 kg) Total Weight Gain 8 lb (3.629 kg) Urinalysis:      Fetal Status: Fetal Heart Rate (bpm): 160   Movement: Absent     General:  Alert, oriented and cooperative. Patient is in no acute distress.  Skin: Skin is warm and dry. No rash noted.   Cardiovascular: Normal heart rate noted  Respiratory: Normal respiratory effort, no problems with respiration noted  Abdomen: Soft, gravid, appropriate for gestational age. Pain/Pressure: Absent     Pelvic:  Cervical exam deferred        Extremities: Normal range of motion.     ental Status: Normal mood and affect. Normal behavior. Normal judgment and thought content.     Assessment   30 y.o. G3P0020 at [redacted]w[redacted]d by  08/02/2021, by Last Menstrual Period presenting for routine prenatal visit  Plan   Pregnancy 3 Problems (from 12/01/20 to present)     Problem Noted Resolved   Supervision of normal pregnancy 12/16/2020 by Vena Austria, MD No   Overview Signed 12/16/2020   8:12 PM by Vena Austria, MD    [facility] Prenatal Labs  Dating LMP = 7 week [place] Blood type: A/Positive/-- (05/06 1544)   Genetic Screen 1 Screen:    AFP:     Quad:     NIPS: Antibody:Negative (05/06 1544)  Anatomic [place]  Rubella: 1.61 (05/06 1544) Varicella: Immune  GTT Early:               Third trimester:  RPR: Non Reactive (05/06 1544)   Rhogam N/A HBsAg: Negative (05/06 1544)   TDaP vaccine                       Flu Shot: HIV: Non Reactive (05/06 1544)   Baby Food                                GBS:   Contraception  Pap:02/10/2019 NILM  CBB     CS/VBAC    Support Person                  Gestational age appropriate obstetric precautions including but not limited to vaginal bleeding, contractions, leaking of fluid and fetal movement were reviewed in detail with the patient.    - anatomy scan already ordered  - Mirgaine discussed Tylenol, magnesium, caffeine and Rx for compazine   Return in about 2 weeks (around 02/21/2021) for ROB.  Vena Austria, MD, [facility], [facility] 02/07/2021, 9:46 AM

## 2021-02-22 ENCOUNTER — Other Ambulatory Visit: Payer: Self-pay

## 2021-02-22 ENCOUNTER — Ambulatory Visit (INDEPENDENT_AMBULATORY_CARE_PROVIDER_SITE_OTHER): Payer: 59 | Admitting: Obstetrics and Gynecology

## 2021-02-22 VITALS — BP 127/80 | Wt 160.0 lb

## 2021-02-22 DIAGNOSIS — Z3400 Encounter for supervision of normal first pregnancy, unspecified trimester: Secondary | ICD-10-CM

## 2021-02-22 DIAGNOSIS — Z3A17 17 weeks gestation of pregnancy: Secondary | ICD-10-CM

## 2021-02-22 LAB — POCT URINALYSIS DIPSTICK OB
Glucose, UA: NEGATIVE
POC,PROTEIN,UA: NEGATIVE

## 2021-02-22 NOTE — Progress Notes (Signed)
    Routine Prenatal Care Visit  Subjective  Carol Steele is a 30 y.o. G3P0020 at [redacted]w[redacted]d being seen today for ongoing prenatal care.  She is currently monitored for the following issues for this low-risk pregnancy and has Sphenoidal sinus polyp and Supervision of normal pregnancy on their problem list.  ----------------------------------------------------------------------------------- Patient reports no complaints.   Contractions: Not present. Vag. Bleeding: None.  Movement: Absent. Denies leaking of fluid.  ----------------------------------------------------------------------------------- The following portions of the patient's history were reviewed and updated as appropriate: allergies, current medications, past family history, past medical history, past social history, past surgical history and problem list. Problem list updated.   Objective  Blood pressure 127/80, weight 160 lb (72.6 kg), last menstrual period 10/26/2020. Pregravid weight 150 lb (68 kg) Total Weight Gain 10 lb (4.536 kg) Urinalysis:      Fetal Status: Fetal Heart Rate (bpm): 150   Movement: Absent     General:  Alert, oriented and cooperative. Patient is in no acute distress.  Skin: Skin is warm and dry. No rash noted.   Cardiovascular: Normal heart rate noted  Respiratory: Normal respiratory effort, no problems with respiration noted  Abdomen: Soft, gravid, appropriate for gestational age. Pain/Pressure: Absent     Pelvic:  Cervical exam deferred        Extremities: Normal range of motion.     ental Status: Normal mood and affect. Normal behavior. Normal judgment and thought content.     Assessment   30 y.o. [id number] at [redacted]w[redacted]d by  08/02/2021, by Last Menstrual Period presenting for routine prenatal visit  Plan   Pregnancy 3 Problems (from 12/01/20 to present)     Problem Noted Resolved   Supervision of normal pregnancy 12/16/2020 by Vena Austria, MD No   Overview Signed 12/16/2020  8:12 PM by Vena Austria, MD    [facility] Prenatal Labs  Dating LMP = 7 week [place] Blood type: A/Positive/-- (05/06 1544)   Genetic Screen NIPS: XY Inheritest negative Antibody:Negative (05/06 1544)  Anatomic [place]  Rubella: 1.61 (05/06 1544) Varicella: Immune  GTT Early:               Third trimester:  RPR: Non Reactive (05/06 1544)   Rhogam N/A HBsAg: Negative (05/06 1544)   TDaP vaccine                       Flu Shot: HIV: Non Reactive (05/06 1544)   Baby Food                                GBS:   Contraception  Pap:02/10/2019 NILM  CBB     CS/VBAC    Support Person                 Gestational age appropriate obstetric precautions including but not limited to vaginal bleeding, contractions, leaking of fluid and fetal movement were reviewed in detail with the patient.    Return Keep 03/21/21 ROB.  Vena Austria, MD, [facility], [facility] 02/22/2021, 3:55 PM

## 2021-02-22 NOTE — Progress Notes (Signed)
ROB - no concerns. RM 2 

## 2021-03-08 ENCOUNTER — Encounter: Payer: 59 | Admitting: Obstetrics and Gynecology

## 2021-03-19 ENCOUNTER — Ambulatory Visit: Admission: RE | Admit: 2021-03-19 | Payer: 59 | Source: Ambulatory Visit

## 2021-03-21 ENCOUNTER — Encounter: Payer: 59 | Admitting: Obstetrics and Gynecology

## 2021-03-28 ENCOUNTER — Other Ambulatory Visit: Payer: Self-pay

## 2021-03-28 ENCOUNTER — Ambulatory Visit
Admission: RE | Admit: 2021-03-28 | Discharge: 2021-03-28 | Disposition: A | Discharge status: Home / Self Care | Payer: 59 | Source: Ambulatory Visit | Attending: Obstetrics and Gynecology | Admitting: Obstetrics and Gynecology

## 2021-03-28 DIAGNOSIS — Z363 Encounter for antenatal screening for malformations: Secondary | ICD-10-CM | POA: Insufficient documentation

## 2021-03-28 DIAGNOSIS — Z3492 Encounter for supervision of normal pregnancy, unspecified, second trimester: Secondary | ICD-10-CM | POA: Diagnosis not present

## 2021-03-28 DIAGNOSIS — Z3400 Encounter for supervision of normal first pregnancy, unspecified trimester: Secondary | ICD-10-CM | POA: Insufficient documentation

## 2021-03-28 DIAGNOSIS — Z3A21 21 weeks gestation of pregnancy: Secondary | ICD-10-CM | POA: Diagnosis not present

## 2021-03-29 ENCOUNTER — Ambulatory Visit (INDEPENDENT_AMBULATORY_CARE_PROVIDER_SITE_OTHER): Payer: 59 | Admitting: Obstetrics and Gynecology

## 2021-03-29 VITALS — BP 106/68 | Wt 166.0 lb

## 2021-03-29 DIAGNOSIS — Z3400 Encounter for supervision of normal first pregnancy, unspecified trimester: Secondary | ICD-10-CM

## 2021-03-29 DIAGNOSIS — Z3A22 22 weeks gestation of pregnancy: Secondary | ICD-10-CM

## 2021-03-29 LAB — POCT URINALYSIS DIPSTICK OB
Glucose, UA: NEGATIVE
POC,PROTEIN,UA: NEGATIVE

## 2021-03-29 NOTE — Progress Notes (Signed)
    Routine Prenatal Care Visit  Subjective  Carol Steele is a 30 y.o. G3P0020 at [redacted]w[redacted]d being seen today for ongoing prenatal care.  She is currently monitored for the following issues for this low-risk pregnancy and has Sphenoidal sinus polyp and Supervision of normal pregnancy on their problem list.  ----------------------------------------------------------------------------------- Patient reports no complaints.   Contractions: Not present. Vag. Bleeding: None.  Movement: Present. Denies leaking of fluid.  ----------------------------------------------------------------------------------- The following portions of the patient's history were reviewed and updated as appropriate: allergies, current medications, past family history, past medical history, past social history, past surgical history and problem list. Problem list updated.   Objective  Blood pressure 106/68, weight 166 lb (75.3 kg), last menstrual period 10/26/2020. Pregravid weight 150 lb (68 kg) Total Weight Gain 16 lb (7.258 kg) Urinalysis:      Fetal Status: Fetal Heart Rate (bpm): 135 Fundal Height: 22 cm Movement: Present     General:  Alert, oriented and cooperative. Patient is in no acute distress.  Skin: Skin is warm and dry. No rash noted.   Cardiovascular: Normal heart rate noted  Respiratory: Normal respiratory effort, no problems with respiration noted  Abdomen: Soft, gravid, appropriate for gestational age. Pain/Pressure: Absent     Pelvic:  Cervical exam deferred        Extremities: Normal range of motion.     ental Status: Normal mood and affect. Normal behavior. Normal judgment and thought content.     Assessment   30 y.o. [id number] at [redacted]w[redacted]d by  08/02/2021, by Last Menstrual Period presenting for routine prenatal visit  Plan   Pregnancy 3 Problems (from 12/01/20 to present)     Problem Noted Resolved   Supervision of normal pregnancy 12/16/2020 by Vena Austria, MD No   Overview Addendum 02/22/2021   3:55 PM by Vena Austria, MD    [facility] Prenatal Labs  Dating LMP = 7 week [place] Blood type: A/Positive/-- (05/06 1544)   Genetic Screen NIPS: XY Inheritest: negative Antibody:Negative (05/06 1544)  Anatomic [place]  Rubella: 1.61 (05/06 1544) Varicella: Immune  GTT Early:               Third trimester:  RPR: Non Reactive (05/06 1544)   Rhogam N/A HBsAg: Negative (05/06 1544)   TDaP vaccine                       Flu Shot: HIV: Non Reactive (05/06 1544)   Baby Food                                GBS:   Contraception  Pap:02/10/2019 NILM  CBB     CS/VBAC    Support Person                 Gestational age appropriate obstetric precautions including but not limited to vaginal bleeding, contractions, leaking of fluid and fetal movement were reviewed in detail with the patient.    - anatomy scan still pending, images reviewed an appear normal although missing worksheet showing fetal weight/growth so unclear if all images uploaded yet  Return in about 4 weeks (around 04/26/2021) for ROB 4 weeks, ROB and 28 week labs in 6 weeks.  Vena Austria, MD, [facility], [facility] 03/29/2021, 4:29 PM

## 2021-03-29 NOTE — Progress Notes (Signed)
ROB - no concerns. RM 2 

## 2021-04-24 ENCOUNTER — Ambulatory Visit (INDEPENDENT_AMBULATORY_CARE_PROVIDER_SITE_OTHER): Payer: 59 | Admitting: Obstetrics and Gynecology

## 2021-04-24 ENCOUNTER — Other Ambulatory Visit: Payer: Self-pay

## 2021-04-24 VITALS — BP 110/60 | Wt 174.0 lb

## 2021-04-24 DIAGNOSIS — Z369 Encounter for antenatal screening, unspecified: Secondary | ICD-10-CM

## 2021-04-24 DIAGNOSIS — Z3400 Encounter for supervision of normal first pregnancy, unspecified trimester: Secondary | ICD-10-CM

## 2021-04-24 DIAGNOSIS — Z3A25 25 weeks gestation of pregnancy: Secondary | ICD-10-CM

## 2021-04-24 LAB — POCT URINALYSIS DIPSTICK OB
Glucose, UA: NEGATIVE
POC,PROTEIN,UA: NEGATIVE

## 2021-04-24 NOTE — Progress Notes (Signed)
ROB - no concerns. RM 4 °

## 2021-04-24 NOTE — Progress Notes (Signed)
    Routine Prenatal Care Visit  Subjective  Carol Steele is a 30 y.o. G3P0020 at [redacted]w[redacted]d being seen today for ongoing prenatal care.  She is currently monitored for the following issues for this low-risk pregnancy and has Sphenoidal sinus polyp and Supervision of normal pregnancy on their problem list.  ----------------------------------------------------------------------------------- Patient reports no complaints.   Contractions: Not present. Vag. Bleeding: None.  Movement: Present. Denies leaking of fluid.  ----------------------------------------------------------------------------------- The following portions of the patient's history were reviewed and updated as appropriate: allergies, current medications, past family history, past medical history, past social history, past surgical history and problem list. Problem list updated.   Objective  Blood pressure 110/60, weight 174 lb (78.9 kg), last menstrual period 10/26/2020. Pregravid weight 150 lb (68 kg) Total Weight Gain 24 lb (10.9 kg) Urinalysis:      Fetal Status: Fetal Heart Rate (bpm): 145   Movement: Present     General:  Alert, oriented and cooperative. Patient is in no acute distress.  Skin: Skin is warm and dry. No rash noted.   Cardiovascular: Normal heart rate noted  Respiratory: Normal respiratory effort, no problems with respiration noted  Abdomen: Soft, gravid, appropriate for gestational age. Pain/Pressure: Absent     Pelvic:  Cervical exam deferred        Extremities: Normal range of motion.     ental Status: Normal mood and affect. Normal behavior. Normal judgment and thought content.     Assessment   30 y.o. [name] at [redacted]w[redacted]d by  08/02/2021, by Last Menstrual Period presenting for routine prenatal visit  Plan   Pregnancy 3 Problems (from 12/01/20 to present)     Problem Noted Resolved   Supervision of normal pregnancy 12/16/2020 by Vena Austria, MD No   Overview Addendum 02/22/2021  3:55 PM by  Vena Austria, MD    [facility] Prenatal Labs  Dating LMP = 7 week [place] Blood type: A/Positive/-- (05/06 1544)   Genetic Screen NIPS: XY Inheritest: negative Antibody:Negative (05/06 1544)  Anatomic [place]  Rubella: 1.61 (05/06 1544) Varicella: Immune  GTT Early:               Third trimester:  RPR: Non Reactive (05/06 1544)   Rhogam N/A HBsAg: Negative (05/06 1544)   TDaP vaccine                       Flu Shot: HIV: Non Reactive (05/06 1544)   Baby Food                                GBS:   Contraception  Pap:02/10/2019 NILM  CBB     CS/VBAC    Support Person                 Gestational age appropriate obstetric precautions including but not limited to vaginal bleeding, contractions, leaking of fluid and fetal movement were reviewed in detail with the patient.    Return keep previously scheduled ROB in October for 28 week labs.  Vena Austria, MD, [facility], [facility] 04/24/2021, 11:25 AM

## 2021-05-10 ENCOUNTER — Other Ambulatory Visit: Payer: 59

## 2021-05-10 ENCOUNTER — Ambulatory Visit (INDEPENDENT_AMBULATORY_CARE_PROVIDER_SITE_OTHER): Payer: 59 | Admitting: Obstetrics and Gynecology

## 2021-05-10 ENCOUNTER — Other Ambulatory Visit: Payer: Self-pay

## 2021-05-10 VITALS — BP 110/60 | Wt 176.0 lb

## 2021-05-10 DIAGNOSIS — Z3400 Encounter for supervision of normal first pregnancy, unspecified trimester: Secondary | ICD-10-CM

## 2021-05-10 DIAGNOSIS — Z369 Encounter for antenatal screening, unspecified: Secondary | ICD-10-CM

## 2021-05-10 DIAGNOSIS — Z3A28 28 weeks gestation of pregnancy: Secondary | ICD-10-CM

## 2021-05-10 LAB — POCT URINALYSIS DIPSTICK OB
Glucose, UA: NEGATIVE
POC,PROTEIN,UA: NEGATIVE

## 2021-05-10 NOTE — Progress Notes (Signed)
    Routine Prenatal Care Visit  Subjective  Carol Steele is a 30 y.o. G3P0020 at [redacted]w[redacted]d being seen today for ongoing prenatal care.  She is currently monitored for the following issues for this low-risk pregnancy and has Sphenoidal sinus polyp and Supervision of normal pregnancy on their problem list.  ----------------------------------------------------------------------------------- Patient reports no complaints.   Contractions: Not present. Vag. Bleeding: None.  Movement: Present. Denies leaking of fluid.  ----------------------------------------------------------------------------------- The following portions of the patient's history were reviewed and updated as appropriate: allergies, current medications, past family history, past medical history, past social history, past surgical history and problem list. Problem list updated.   Objective  Blood pressure 110/60, weight 176 lb (79.8 kg), last menstrual period 10/26/2020. Pregravid weight 150 lb (68 kg) Total Weight Gain 26 lb (11.8 kg) Urinalysis:      Fetal Status: Fetal Heart Rate (bpm): 149 Fundal Height: 27 cm Movement: Present  Presentation: Vertex  General:  Alert, oriented and cooperative. Patient is in no acute distress.  Skin: Skin is warm and dry. No rash noted.   Cardiovascular: Normal heart rate noted  Respiratory: Normal respiratory effort, no problems with respiration noted  Abdomen: Soft, gravid, appropriate for gestational age. Pain/Pressure: Present     Pelvic:  Cervical exam deferred        Extremities: Normal range of motion.     ental Status: Normal mood and affect. Normal behavior. Normal judgment and thought content.     Assessment   30 y.o. [id number] at [redacted]w[redacted]d by  08/02/2021, by Last Menstrual Period presenting for routine prenatal visit  Plan   Pregnancy 3 Problems (from 12/01/20 to present)     Problem Noted Resolved   Supervision of normal pregnancy 12/16/2020 by Vena Austria, MD No   Overview  Addendum 02/22/2021  3:55 PM by Vena Austria, MD    [facility] Prenatal Labs  Dating LMP = 7 week [place] Blood type: A/Positive/-- (05/06 1544)   Genetic Screen NIPS: XY Inheritest: negative Antibody:Negative (05/06 1544)  Anatomic [place]  Rubella: 1.61 (05/06 1544) Varicella: Immune  GTT Early:               Third trimester:  RPR: Non Reactive (05/06 1544)   Rhogam N/A HBsAg: Negative (05/06 1544)   TDaP vaccine                       Flu Shot: HIV: Non Reactive (05/06 1544)   Baby Food                                GBS:   Contraception  Pap:02/10/2019 NILM  CBB     CS/VBAC    Support Person                 Gestational age appropriate obstetric precautions including but not limited to vaginal bleeding, contractions, leaking of fluid and fetal movement were reviewed in detail with the patient.    28 week labs today  Return in about 2 weeks (around 05/24/2021) for 2 week visits for the next 8 weeks ROB.  Vena Austria, MD, [facility], [facility] 05/10/2021, 9:17 AM

## 2021-05-10 NOTE — Progress Notes (Signed)
ROB - 1 hr gtt, Rt rib pain off and on. RM 6

## 2021-05-11 LAB — 28 WEEK RH+PANEL
Basophils Absolute: 0 10*3/uL (ref 0.0–0.2)
Basos: 0 %
EOS (ABSOLUTE): 0 10*3/uL (ref 0.0–0.4)
Eos: 0 %
Gestational Diabetes Screen: 87 mg/dL (ref 70–139)
HIV Screen 4th Generation wRfx: NONREACTIVE
Hematocrit: 35.1 % (ref 34.0–46.6)
Hemoglobin: 11.5 g/dL (ref 11.1–15.9)
Immature Grans (Abs): 0.1 10*3/uL (ref 0.0–0.1)
Immature Granulocytes: 1 %
Lymphocytes Absolute: 1.2 10*3/uL (ref 0.7–3.1)
Lymphs: 16 %
MCH: 29.5 pg (ref 26.6–33.0)
MCHC: 32.8 g/dL (ref 31.5–35.7)
MCV: 90 fL (ref 79–97)
Monocytes Absolute: 0.6 10*3/uL (ref 0.1–0.9)
Monocytes: 8 %
Neutrophils Absolute: 5.8 10*3/uL (ref 1.4–7.0)
Neutrophils: 75 %
Platelets: 220 10*3/uL (ref 150–450)
RBC: 3.9 x10E6/uL (ref 3.77–5.28)
RDW: 12 % (ref 11.7–15.4)
RPR Ser Ql: NONREACTIVE
WBC: 7.7 10*3/uL (ref 3.4–10.8)

## 2021-05-22 ENCOUNTER — Ambulatory Visit (INDEPENDENT_AMBULATORY_CARE_PROVIDER_SITE_OTHER): Payer: 59 | Admitting: Obstetrics and Gynecology

## 2021-05-22 ENCOUNTER — Other Ambulatory Visit: Payer: Self-pay

## 2021-05-22 VITALS — BP 108/60 | Wt 180.0 lb

## 2021-05-22 DIAGNOSIS — Z23 Encounter for immunization: Secondary | ICD-10-CM | POA: Diagnosis not present

## 2021-05-22 DIAGNOSIS — Z3400 Encounter for supervision of normal first pregnancy, unspecified trimester: Secondary | ICD-10-CM

## 2021-05-22 DIAGNOSIS — Z3A29 29 weeks gestation of pregnancy: Secondary | ICD-10-CM

## 2021-05-22 LAB — POCT URINALYSIS DIPSTICK OB
Glucose, UA: NEGATIVE
POC,PROTEIN,UA: NEGATIVE

## 2021-05-22 NOTE — Progress Notes (Signed)
    Routine Prenatal Care Visit  Subjective  Carol Steele is a 30 y.o. G3P0020 at [redacted]w[redacted]d being seen today for ongoing prenatal care.  She is currently monitored for the following issues for this low-risk pregnancy and has Sphenoidal sinus polyp and Supervision of normal pregnancy on their problem list.  ----------------------------------------------------------------------------------- Patient reports no complaints.   Contractions: Not present. Vag. Bleeding: None.  Movement: Present. Denies leaking of fluid.  ----------------------------------------------------------------------------------- The following portions of the patient's history were reviewed and updated as appropriate: allergies, current medications, past family history, past medical history, past social history, past surgical history and problem list. Problem list updated.   Objective  Blood pressure 108/60, weight 180 lb (81.6 kg), last menstrual period 10/26/2020. Pregravid weight 150 lb (68 kg) Total Weight Gain 30 lb (13.6 kg) Urinalysis:      Fetal Status: Fetal Heart Rate (bpm): 140 Fundal Height: 30 cm Movement: Present  Presentation: Vertex  General:  Alert, oriented and cooperative. Patient is in no acute distress.  Skin: Skin is warm and dry. No rash noted.   Cardiovascular: Normal heart rate noted  Respiratory: Normal respiratory effort, no problems with respiration noted  Abdomen: Soft, gravid, appropriate for gestational age. Pain/Pressure: Present     Pelvic:  Cervical exam deferred        Extremities: Normal range of motion.     ental Status: Normal mood and affect. Normal behavior. Normal judgment and thought content.     Assessment   30 y.o. [id number] at [redacted]w[redacted]d by  08/02/2021, by Last Menstrual Period presenting for routine prenatal visit  Plan   Pregnancy 3 Problems (from 12/01/20 to present)     Problem Noted Resolved   Supervision of normal pregnancy 12/16/2020 by Vena Austria, MD No   Overview  Addendum 05/11/2021  1:50 PM by Vena Austria, MD    [facility] Prenatal Labs  Dating LMP = 7 week [place] Blood type: A/Positive/-- (05/06 1544)   Genetic Screen NIPS: XY Inheritest: negative Antibody:Negative (05/06 1544)  Anatomic [place]  Rubella: 1.61 (05/06 1544) Varicella: Immune  GTT Third trimester: 87 RPR: Non Reactive (05/06 1544)   Rhogam N/A HBsAg: Negative (05/06 1544)   TDaP vaccine                       Flu Shot: HIV: Non Reactive (05/06 1544)   Baby Food                                GBS:   Contraception  Pap:02/10/2019 NILM  CBB     CS/VBAC    Support Person                 Gestational age appropriate obstetric precautions including but not limited to vaginal bleeding, contractions, leaking of fluid and fetal movement were reviewed in detail with the patient.    - TDAP today  Return in about 2 weeks (around 06/05/2021) for ROB.  Vena Austria, MD, [facility], [facility] 05/22/2021, 4:30 PM

## 2021-05-22 NOTE — Progress Notes (Signed)
ROB - no concerns. RM 6 

## 2021-06-06 ENCOUNTER — Ambulatory Visit (INDEPENDENT_AMBULATORY_CARE_PROVIDER_SITE_OTHER): Payer: 59 | Admitting: Obstetrics and Gynecology

## 2021-06-06 ENCOUNTER — Other Ambulatory Visit: Payer: Self-pay

## 2021-06-06 VITALS — BP 125/81 | Wt 180.0 lb

## 2021-06-06 DIAGNOSIS — Z23 Encounter for immunization: Secondary | ICD-10-CM | POA: Diagnosis not present

## 2021-06-06 DIAGNOSIS — Z3A31 31 weeks gestation of pregnancy: Secondary | ICD-10-CM

## 2021-06-06 DIAGNOSIS — Z3403 Encounter for supervision of normal first pregnancy, third trimester: Secondary | ICD-10-CM

## 2021-06-06 LAB — POCT URINALYSIS DIPSTICK OB
Glucose, UA: NEGATIVE
POC,PROTEIN,UA: NEGATIVE

## 2021-06-06 NOTE — Progress Notes (Signed)
    Routine Prenatal Care Visit  Subjective  Carol Steele is a 30 y.o. G3P0020 at [redacted]w[redacted]d being seen today for ongoing prenatal care.  She is currently monitored for the following issues for this low-risk pregnancy and has Sphenoidal sinus polyp and Supervision of normal pregnancy on their problem list.  ----------------------------------------------------------------------------------- Patient reports no complaints.   Contractions: Not present. Vag. Bleeding: None.  Movement: Present. Denies leaking of fluid.  ----------------------------------------------------------------------------------- The following portions of the patient's history were reviewed and updated as appropriate: allergies, current medications, past family history, past medical history, past social history, past surgical history and problem list. Problem list updated.   Objective  Blood pressure 125/81, weight 180 lb (81.6 kg), last menstrual period 10/26/2020. Pregravid weight 150 lb (68 kg) Total Weight Gain 30 lb (13.6 kg) Urinalysis:      Fetal Status: Fetal Heart Rate (bpm): 135 Fundal Height: 32 cm Movement: Present  Presentation: Vertex  General:  Alert, oriented and cooperative. Patient is in no acute distress.  Skin: Skin is warm and dry. No rash noted.   Cardiovascular: Normal heart rate noted  Respiratory: Normal respiratory effort, no problems with respiration noted  Abdomen: Soft, gravid, appropriate for gestational age. Pain/Pressure: Absent     Pelvic:  Cervical exam deferred        Extremities: Normal range of motion.     ental Status: Normal mood and affect. Normal behavior. Normal judgment and thought content.     Assessment   30 y.o. [id number] at [redacted]w[redacted]d by  08/02/2021, by Last Menstrual Period presenting for routine prenatal visit  Plan   Pregnancy 3 Problems (from 12/01/20 to present)     Problem Noted Resolved   Supervision of normal pregnancy 12/16/2020 by Vena Austria, MD No   Overview  Addendum 05/29/2021  9:07 AM by Vena Austria, MD    [facility] Prenatal Labs  Dating LMP = 7 week [place] Blood type: A/Positive/-- (05/06 1544)   Genetic Screen NIPS: XY Inheritest: negative Antibody:Negative (05/06 1544)  Anatomic [place]  Rubella: 1.61 (05/06 1544) Varicella: Immune  GTT Third trimester: 87 RPR: Non Reactive (05/06 1544)   Rhogam N/A HBsAg: Negative (05/06 1544)   TDaP vaccine 05/22/21  Flu Shot: HIV: Non Reactive (05/06 1544)   Baby Food                                GBS:   Contraception  Pap:02/10/2019 NILM  CBB     CS/VBAC    Support Person                 Gestational age appropriate obstetric precautions including but not limited to vaginal bleeding, contractions, leaking of fluid and fetal movement were reviewed in detail with the patient.    Return in about 2 weeks (around 06/20/2021) for ROB.  Vena Austria, MD, [facility], [facility] 06/06/2021, 4:32 PM

## 2021-06-20 ENCOUNTER — Other Ambulatory Visit: Payer: Self-pay

## 2021-06-20 ENCOUNTER — Ambulatory Visit (INDEPENDENT_AMBULATORY_CARE_PROVIDER_SITE_OTHER): Payer: 59 | Admitting: Obstetrics and Gynecology

## 2021-06-20 VITALS — BP 110/60 | Wt 183.0 lb

## 2021-06-20 DIAGNOSIS — Z3A33 33 weeks gestation of pregnancy: Secondary | ICD-10-CM

## 2021-06-20 DIAGNOSIS — Z3400 Encounter for supervision of normal first pregnancy, unspecified trimester: Secondary | ICD-10-CM

## 2021-06-20 LAB — POCT URINALYSIS DIPSTICK OB
Glucose, UA: NEGATIVE
POC,PROTEIN,UA: NEGATIVE

## 2021-06-20 NOTE — Progress Notes (Signed)
ROB - no concerns. RM 4 °

## 2021-06-20 NOTE — Progress Notes (Signed)
    Routine Prenatal Care Visit  Subjective  Carol Steele is a 30 y.o. G3P0020 at [redacted]w[redacted]d being seen today for ongoing prenatal care.  She is currently monitored for the following issues for this low-risk pregnancy and has Sphenoidal sinus polyp and Supervision of normal pregnancy on their problem list.  ----------------------------------------------------------------------------------- Patient reports no complaints.   Contractions: Not present. Vag. Bleeding: None.  Movement: Present. Denies leaking of fluid.  ----------------------------------------------------------------------------------- The following portions of the patient's history were reviewed and updated as appropriate: allergies, current medications, past family history, past medical history, past social history, past surgical history and problem list. Problem list updated.   Objective  Blood pressure 110/60, weight 183 lb (83 kg), last menstrual period 10/26/2020. Pregravid weight 150 lb (68 kg) Total Weight Gain 33 lb (15 kg) Urinalysis:      Fetal Status: Fetal Heart Rate (bpm): 150 Fundal Height: 33 cm Movement: Present  Presentation: Vertex  General:  Alert, oriented and cooperative. Patient is in no acute distress.  Skin: Skin is warm and dry. No rash noted.   Cardiovascular: Normal heart rate noted  Respiratory: Normal respiratory effort, no problems with respiration noted  Abdomen: Soft, gravid, appropriate for gestational age. Pain/Pressure: Absent     Pelvic:  Cervical exam deferred        Extremities: Normal range of motion.     ental Status: Normal mood and affect. Normal behavior. Normal judgment and thought content.     Assessment   30 y.o. [id number] at [redacted]w[redacted]d by  08/02/2021, by Last Menstrual Period presenting for routine prenatal visit  Plan   Pregnancy 3 Problems (from 12/01/20 to present)     Problem Noted Resolved   Supervision of normal pregnancy 12/16/2020 by Vena Austria, MD No   Overview  Addendum 05/29/2021  9:07 AM by Vena Austria, MD    [facility] Prenatal Labs  Dating LMP = 7 week [place] Blood type: A/Positive/-- (05/06 1544)   Genetic Screen NIPS: XY Inheritest: negative Antibody:Negative (05/06 1544)  Anatomic [place]  Rubella: 1.61 (05/06 1544) Varicella: Immune  GTT Third trimester: 87 RPR: Non Reactive (05/06 1544)   Rhogam N/A HBsAg: Negative (05/06 1544)   TDaP vaccine 05/22/21  Flu Shot: HIV: Non Reactive (05/06 1544)   Baby Food                                GBS:   Contraception  Pap:02/10/2019 NILM  CBB     CS/VBAC    Support Person                 Gestational age appropriate obstetric precautions including but not limited to vaginal bleeding, contractions, leaking of fluid and fetal movement were reviewed in detail with the patient.    Return in about 2 weeks (around 07/04/2021) for ROB 2 weeks, then weekly for 3 weeks.  Vena Austria, MD, [facility], [facility] 06/20/2021, 4:26 PM

## 2021-06-28 DIAGNOSIS — Z3483 Encounter for supervision of other normal pregnancy, third trimester: Secondary | ICD-10-CM | POA: Diagnosis not present

## 2021-06-28 DIAGNOSIS — Z3482 Encounter for supervision of other normal pregnancy, second trimester: Secondary | ICD-10-CM | POA: Diagnosis not present

## 2021-07-03 ENCOUNTER — Encounter: Payer: 59 | Admitting: Obstetrics and Gynecology

## 2021-07-06 ENCOUNTER — Other Ambulatory Visit: Payer: Self-pay

## 2021-07-06 ENCOUNTER — Encounter: Payer: Self-pay | Admitting: Licensed Practical Nurse

## 2021-07-06 ENCOUNTER — Ambulatory Visit (INDEPENDENT_AMBULATORY_CARE_PROVIDER_SITE_OTHER): Payer: 59 | Admitting: Licensed Practical Nurse

## 2021-07-06 VITALS — BP 112/76 | Wt 187.0 lb

## 2021-07-06 DIAGNOSIS — Z3685 Encounter for antenatal screening for Streptococcus B: Secondary | ICD-10-CM

## 2021-07-06 DIAGNOSIS — Z3400 Encounter for supervision of normal first pregnancy, unspecified trimester: Secondary | ICD-10-CM

## 2021-07-06 DIAGNOSIS — Z3A36 36 weeks gestation of pregnancy: Secondary | ICD-10-CM

## 2021-07-06 LAB — POCT URINALYSIS DIPSTICK OB
Glucose, UA: NEGATIVE
POC,PROTEIN,UA: NEGATIVE

## 2021-07-06 NOTE — Progress Notes (Signed)
  Routine Prenatal Care Visit  Subjective  Carol Steele is a 30 y.o. G3P0020 at [redacted]w[redacted]d being seen today for ongoing prenatal care.  She is currently monitored for the following issues for this low-risk pregnancy and has Sphenoidal sinus polyp and Supervision of normal pregnancy on their problem list.  ----------------------------------------------------------------------------------- Patient reports no complaints.  Here with partner.  Questions about labor and process in the hospital reviewed.   .  .   Pincus Large Fluid denies.  ----------------------------------------------------------------------------------- The following portions of the patient's history were reviewed and updated as appropriate: allergies, current medications, past family history, past medical history, past social history, past surgical history and problem list. Problem list updated.  Objective  Blood pressure 112/76, weight 187 lb (84.8 kg), last menstrual period 10/26/2020. Pregravid weight 150 lb (68 kg) Total Weight Gain 37 lb (16.8 kg) Urinalysis: Urine Protein    Urine Glucose    Fetal Status:        active  General:  Alert, oriented and cooperative. Patient is in no acute distress.  Skin: Skin is warm and dry. No rash noted.   Cardiovascular: Normal heart rate noted  Respiratory: Normal respiratory effort, no problems with respiration noted  Abdomen: Soft, gravid, appropriate for gestational age.       Pelvic:  Cervical exam deferred        Extremities: Normal range of motion.     Mental Status: Normal mood and affect. Normal behavior. Normal judgment and thought content.   Assessment   30 y.o. V3X1062 at [redacted]w[redacted]d by  08/02/2021, by Last Menstrual Period presenting for routine prenatal visit Screening for GBS   Plan   Pregnancy 3 Problems (from 12/01/20 to present)    Problem Noted Resolved   Supervision of normal pregnancy 12/16/2020 by Vena Austria, MD No   Overview Addendum 05/29/2021  9:07 AM by  Vena Austria, MD    [facility] Prenatal Labs  Dating LMP = 7 week [place] Blood type: A/Positive/-- (05/06 1544)   Genetic Screen NIPS: XY Inheritest: negative Antibody:Negative (05/06 1544)  Anatomic [place]  Rubella: 1.61 (05/06 1544) Varicella: Immune  GTT Third trimester: 87 RPR: Non Reactive (05/06 1544)   Rhogam N/A HBsAg: Negative (05/06 1544)   TDaP vaccine 05/22/21  Flu Shot: HIV: Non Reactive (05/06 1544)   Baby Food                                GBS:   Contraception  Pap:02/10/2019 NILM  CBB     CS/VBAC    Support Person                Term labor symptoms and general obstetric precautions including but not limited to vaginal bleeding, contractions, leaking of fluid and fetal movement were reviewed in detail with the patient. Please refer to After Visit Summary for other counseling recommendations.  GBS collected   Return in about 1 week (around 07/13/2021) for ROB.  Carie Caddy, CNM  [facility], [facility]  07/06/21  10:55 AM

## 2021-07-06 NOTE — Progress Notes (Signed)
ROB - GBS, no concerns. RM 8

## 2021-07-09 DIAGNOSIS — M5489 Other dorsalgia: Secondary | ICD-10-CM | POA: Diagnosis not present

## 2021-07-10 LAB — STREP GP B SUSCEPTIBILITY

## 2021-07-10 LAB — STREP GP B CULTURE+RFLX: Strep Gp B Culture+Rflx: POSITIVE — AB

## 2021-07-12 ENCOUNTER — Encounter: Payer: Self-pay | Admitting: Obstetrics and Gynecology

## 2021-07-12 ENCOUNTER — Ambulatory Visit (INDEPENDENT_AMBULATORY_CARE_PROVIDER_SITE_OTHER): Payer: 59 | Admitting: Obstetrics and Gynecology

## 2021-07-12 ENCOUNTER — Encounter: Payer: Self-pay | Admitting: Obstetrics & Gynecology

## 2021-07-12 ENCOUNTER — Other Ambulatory Visit: Payer: Self-pay

## 2021-07-12 ENCOUNTER — Observation Stay
Admission: EM | Admit: 2021-07-12 | Discharge: 2021-07-12 | Disposition: A | Discharge status: Home / Self Care | Payer: 59 | Attending: Obstetrics & Gynecology | Admitting: Obstetrics & Gynecology

## 2021-07-12 VITALS — BP 136/90 | Wt 188.0 lb

## 2021-07-12 DIAGNOSIS — Z3A37 37 weeks gestation of pregnancy: Secondary | ICD-10-CM

## 2021-07-12 DIAGNOSIS — Z3400 Encounter for supervision of normal first pregnancy, unspecified trimester: Secondary | ICD-10-CM

## 2021-07-12 DIAGNOSIS — O10013 Pre-existing essential hypertension complicating pregnancy, third trimester: Principal | ICD-10-CM | POA: Insufficient documentation

## 2021-07-12 DIAGNOSIS — O163 Unspecified maternal hypertension, third trimester: Secondary | ICD-10-CM | POA: Diagnosis present

## 2021-07-12 HISTORY — DX: Unspecified maternal hypertension, third trimester: O16.3

## 2021-07-12 LAB — CBC
HCT: 31.1 % — ABNORMAL LOW (ref 36.0–46.0)
Hemoglobin: 10.5 g/dL — ABNORMAL LOW (ref 12.0–15.0)
MCH: 28.5 pg (ref 26.0–34.0)
MCHC: 33.8 g/dL (ref 30.0–36.0)
MCV: 84.5 fL (ref 80.0–100.0)
Platelets: 174 10*3/uL (ref 150–400)
RBC: 3.68 MIL/uL — ABNORMAL LOW (ref 3.87–5.11)
RDW: 12.9 % (ref 11.5–15.5)
WBC: 6.4 10*3/uL (ref 4.0–10.5)
nRBC: 0 % (ref 0.0–0.2)

## 2021-07-12 LAB — POCT URINALYSIS DIPSTICK OB
Glucose, UA: NEGATIVE
POC,PROTEIN,UA: NEGATIVE

## 2021-07-12 LAB — COMPREHENSIVE METABOLIC PANEL
ALT: 44 U/L (ref 0–44)
AST: 38 U/L (ref 15–41)
Albumin: 3.3 g/dL — ABNORMAL LOW (ref 3.5–5.0)
Alkaline Phosphatase: 176 U/L — ABNORMAL HIGH (ref 38–126)
Anion gap: 8 (ref 5–15)
BUN: 10 mg/dL (ref 6–20)
CO2: 20 mmol/L — ABNORMAL LOW (ref 22–32)
Calcium: 8.8 mg/dL — ABNORMAL LOW (ref 8.9–10.3)
Chloride: 107 mmol/L (ref 98–111)
Creatinine, Ser: 0.42 mg/dL — ABNORMAL LOW (ref 0.44–1.00)
GFR, Estimated: >60 mL/min (ref >60)
Glucose, Bld: 74 mg/dL (ref 70–99)
Potassium: 3.3 mmol/L — ABNORMAL LOW (ref 3.5–5.1)
Sodium: 135 mmol/L (ref 135–145)
Total Bilirubin: 0.9 mg/dL (ref 0.3–1.2)
Total Protein: 6.7 g/dL (ref 6.5–8.1)

## 2021-07-12 LAB — PROTEIN / CREATININE RATIO, URINE
Creatinine, Urine: 61 mg/dL
Protein Creatinine Ratio: 0.11 mg/mg{Cre} (ref 0.00–0.15)
Total Protein, Urine: 7 mg/dL

## 2021-07-12 NOTE — OB Triage Note (Signed)
Pt presents to L&D from office for PIH eval. Pt denies Headache, Blurred vision, or spots but does report feeling lightheaded occasionally. Pt reports good fetal movement, denies, leaking fluid, vaginal bleeding, or contraction. EFM applied and explained. PLan to monitor fetal and maternal well being and assess Bps.

## 2021-07-12 NOTE — Progress Notes (Signed)
ROB - no concerns. RM 2 

## 2021-07-12 NOTE — Progress Notes (Signed)
° ° °  Routine Prenatal Care Visit  Subjective  Carol Steele is a 30 y.o. G3P0020 at [redacted]w[redacted]d being seen today for ongoing prenatal care.  She is currently monitored for the following issues for this low-risk pregnancy and has Sphenoidal sinus polyp and Supervision of normal pregnancy on their problem list.  ----------------------------------------------------------------------------------- Patient reports no complaints.   Contractions: Not present. Vag. Bleeding: None.  Movement: Present. Denies leaking of fluid.  ----------------------------------------------------------------------------------- The following portions of the patient's history were reviewed and updated as appropriate: allergies, current medications, past family history, past medical history, past social history, past surgical history and problem list. Problem list updated.   Objective  Blood pressure 136/90, weight 188 lb (85.3 kg), last menstrual period 10/26/2020. Repeat 145/92 Pregravid weight 150 lb (68 kg) Total Weight Gain 38 lb (17.2 kg) Urinalysis:      Fetal Status: Fetal Heart Rate (bpm): 140 Fundal Height: 36 cm Movement: Present  Presentation: Vertex  General:  Alert, oriented and cooperative. Patient is in no acute distress.  Skin: Skin is warm and dry. No rash noted.   Cardiovascular: Normal heart rate noted  Respiratory: Normal respiratory effort, no problems with respiration noted  Abdomen: Soft, gravid, appropriate for gestational age. Pain/Pressure: Absent     Pelvic:  Cervical exam deferred        Extremities: Normal range of motion.     ental Status: Normal mood and affect. Normal behavior. Normal judgment and thought content.     Assessment   30 y.o. [id number] at [redacted]w[redacted]d by  08/02/2021, by Last Menstrual Period presenting for routine prenatal visit  Plan   Pregnancy 3 Problems (from 12/01/20 to present)     Problem Noted Resolved   Supervision of normal pregnancy 12/16/2020 by Vena Austria, MD  No   Overview Addendum 05/29/2021  9:07 AM by Vena Austria, MD    [facility] Prenatal Labs  Dating LMP = 7 week [place] Blood type: A/Positive/-- (05/06 1544)   Genetic Screen NIPS: XY Inheritest: negative Antibody:Negative (05/06 1544)  Anatomic [place]  Rubella: 1.61 (05/06 1544) Varicella: Immune  GTT Third trimester: 87 RPR: Non Reactive (05/06 1544)   Rhogam N/A HBsAg: Negative (05/06 1544)   TDaP vaccine 05/22/21  Flu Shot: HIV: Non Reactive (05/06 1544)   Baby Food                                GBS:   Contraception  Pap:02/10/2019 NILM  CBB     CS/VBAC    Support Person                 Gestational age appropriate obstetric precautions including but not limited to vaginal bleeding, contractions, leaking of fluid and fetal movement were reviewed in detail with the patient.    - 1) Elevated BP - only 1lbs weight gain in the past week, no headaches, vision cchanges, RUQ or epigastric pain.  Negative for proteinuria.  Will send to L&D for serial BP and labs.  If work up normal has follow up this upcoming Tuesday for ROB and repeat BP check  Return in about 5 days (around 07/17/2021) for ROB.  Vena Austria, MD, [facility], [facility] 07/12/2021, 3:50 PM

## 2021-07-12 NOTE — Discharge Summary (Signed)
  See FPN 

## 2021-07-12 NOTE — Discharge Instructions (Signed)
Return with decreased fetal movement, vaginal bleeding, leaking of fluid, or strong regular contractions

## 2021-07-12 NOTE — Final Progress Note (Signed)
Physician Final Progress Note  Patient ID: TIDA SANER MRN: [id number] DOB/AGE: 1991-06-21 30 y.o.  Admit date: 07/12/2021 Admitting provider: Vena Austria, MD Discharge date: 07/12/2021  Admission Diagnoses: Elevated blood pressure complicating pregnancy in third trimester, antepartum  37 weeks  Discharge Diagnoses:  Principal Problem:   Elevated blood pressure complicating pregnancy in third trimester, antepartum  37 weeks  Consults: None  Significant Findings/ Diagnostic Studies:  Obstetrics Admission History & Physical   No chief complaint on file.   HPI:  30 y.o. A8T4196 @ [redacted]w[redacted]d (08/02/2021, by Last Menstrual Period). Admitted on 07/12/2021:   Patient Active Problem List   Diagnosis Date Noted   Labor and delivery indication for care or intervention 07/12/2021   Elevated blood pressure complicating pregnancy in third trimester, antepartum 07/12/2021   Supervision of normal pregnancy 12/16/2020   Sphenoidal sinus polyp 08/09/2013     Presents for elevated BP noted in office. Pt denies ha, SOB, CP, visual changes, epigastric pain, edema.  No s/sx labor.   [facility]atal care at: at [facility]. Pregnancy complicated by none.  ROS: A review of systems was performed and negative, except as stated in the above HPI. PMHx:  Past Medical History:  Diagnosis Date   Anxiety 2020   PSHx:  Past Surgical History:  Procedure Laterality Date   DILATION AND EVACUATION N/A 08/26/2019   Procedure: SUCTION D&C;  Surgeon: Vena Austria, MD;  Location: [facility] ORS;  Service: Gynecology;  Laterality: N/A;   Medications:  Medications Prior to Admission  Medication Sig Dispense Refill Last Dose   Prenatal Vit-Fe Fumarate-FA (MULTIVITAMIN-PRENATAL) 27-0.8 MG TABS tablet Take 1 tablet by mouth daily at 12 noon.   07/11/2021   Allergies: is allergic to amoxicillin, cefaclor, clarithromycin, penicillins, and sulfamethoxazole-trimethoprim. OBHx:  OB History  Gravida Para Term Preterm AB  Living  3       2    SAB IAB Ectopic Multiple Live Births  2            # Outcome Date GA Lbr Len/2nd Weight Sex Delivery Anes PTL Lv  3 Current           2 SAB 07/2019          1 SAB 2015           QIW:LNLGXQJJ/HERDEYCXKGYJ except as detailed in HPI.Marland Kitchen  No family history of birth defects. Soc Hx: Alcohol: none and Recreational drug use: none  Objective:   Vitals:   07/12/21 1816 07/12/21 1831  BP: 118/70 123/69  Pulse: 90 82  Resp:    Temp:     Constitutional: Well nourished, well developed female in no acute distress.  HEENT: normal Skin: Warm and dry.  Cardiovascular:Regular rate and rhythm.   Extremity: trace to 1+ bilateral pedal edema Respiratory: Clear to auscultation bilateral. Normal respiratory effort Abdomen: gravid, ND, FHT present, mild tenderness on exam Back: no CVAT Neuro: DTRs 2+, Cranial nerves grossly intact Psych: Alert and Oriented x3. No memory deficits. Normal mood and affect.  MS: normal gait, normal bilateral lower extremity ROM/strength/stability.  Results for orders placed or performed during the hospital encounter of 07/12/21  CBC  Result Value Ref Range   WBC 6.4 4.0 - 10.5 K/uL   RBC 3.68 (L) 3.87 - 5.11 MIL/uL   Hemoglobin 10.5 (L) 12.0 - 15.0 g/dL   HCT 85.6 (L) 31.4 - 97.0 %   MCV 84.5 80.0 - 100.0 fL   MCH 28.5 26.0 - 34.0 pg   MCHC 33.8 30.0 -  36.0 g/dL   RDW 89.1 69.4 - 50.3 %   Platelets 174 150 - 400 K/uL   nRBC 0.0 0.0 - 0.2 %  Protein / creatinine ratio, urine  Result Value Ref Range   Creatinine, Urine 61 mg/dL   Total Protein, Urine 7 mg/dL   Protein Creatinine Ratio 0.11 0.00 - 0.15 mg/mg[Cre]  Comprehensive metabolic panel  Result Value Ref Range   Sodium 135 135 - 145 mmol/L   Potassium 3.3 (L) 3.5 - 5.1 mmol/L   Chloride 107 98 - 111 mmol/L   CO2 20 (L) 22 - 32 mmol/L   Glucose, Bld 74 70 - 99 mg/dL   BUN 10 6 - 20 mg/dL   Creatinine, Ser 8.88 (L) 0.44 - 1.00 mg/dL   Calcium 8.8 (L) 8.9 - 10.3 mg/dL   Total  Protein 6.7 6.5 - 8.1 g/dL   Albumin 3.3 (L) 3.5 - 5.0 g/dL   AST 38 15 - 41 U/L   ALT 44 0 - 44 U/L   Alkaline Phosphatase 176 (H) 38 - 126 U/L   Total Bilirubin 0.9 0.3 - 1.2 mg/dL   GFR, Estimated >28 >00 mL/min   Anion gap 8 5 - 15   RPR NR / HIV Neg/ HBsAg Neg   Assessment & Plan:   30 y.o. [id number] @ [redacted]w[redacted]d, Admitted on 07/12/2021:No s/sx preeclampsia Normalized BPs Risks of Gest HTN discussed    Procedures: A NST procedure was performed with FHR monitoring and a normal baseline established, appropriate time of 20-40 minutes of evaluation, and accels >2 seen w 15x15 characteristics.  Results show a REACTIVE NST.    Discharge Condition: good  Disposition: Discharge disposition: 01-Home or Self Care       Diet: Regular diet  Discharge Activity: Activity as tolerated  Discharge Instructions     Call MD for:   Complete by: As directed    Worsening contractions or pain; leakage of fluid; bleeding.   Diet - low sodium heart healthy   Complete by: As directed    Increase activity slowly   Complete by: As directed       Allergies as of 07/12/2021       Reactions   Amoxicillin Rash   Cefaclor Hives, Rash   Clarithromycin Rash   Penicillins Hives, Rash   Sulfamethoxazole-trimethoprim Rash        Medication List     TAKE these medications    multivitamin-prenatal 27-0.8 MG Tabs tablet Take 1 tablet by mouth daily at 12 noon.        Follow-up Information     [facility]. Go in 1 week(s).   Specialty: Obstetrics and Gynecology Why: As Scheduled Contact information: [address] [place] [address] [phone]                Total time spent taking care of this patient: 20 minutes  Signed: Letitia Libra 07/12/2021, 7:02 PM

## 2021-07-17 ENCOUNTER — Other Ambulatory Visit: Payer: Self-pay

## 2021-07-17 ENCOUNTER — Ambulatory Visit (INDEPENDENT_AMBULATORY_CARE_PROVIDER_SITE_OTHER): Payer: 59 | Admitting: Obstetrics and Gynecology

## 2021-07-17 VITALS — BP 126/72 | Wt 189.0 lb

## 2021-07-17 DIAGNOSIS — Z3400 Encounter for supervision of normal first pregnancy, unspecified trimester: Secondary | ICD-10-CM

## 2021-07-17 DIAGNOSIS — Z3A37 37 weeks gestation of pregnancy: Secondary | ICD-10-CM

## 2021-07-17 LAB — POCT URINALYSIS DIPSTICK OB
Glucose, UA: NEGATIVE
POC,PROTEIN,UA: NEGATIVE

## 2021-07-17 NOTE — Progress Notes (Signed)
Obstetric H&P   Chief Complaint: ROB  Prenatal Care Provider: [facility]  History of Present Illness: 30 y.o. [id number] [redacted]w[redacted]d by 08/02/2021, by Last Menstrual Period presenting to for ROB visit and IOL scheduling.  +FM, no LOF, no VB, no Ctx.  Pregnancy has been uncomplicated  Pregravid weight 150 lb (68 kg) Total Weight Gain 39 lb (17.7 kg)  Pregnancy 3 Problems (from 12/01/20 to present)     Problem Noted Resolved   Elevated blood pressure complicating pregnancy in third trimester, antepartum 07/12/2021 by Nadara Mustard, MD No   Supervision of normal pregnancy 12/16/2020 by Vena Austria, MD No   Overview Addendum 05/29/2021  9:07 AM by Vena Austria, MD    [facility]inic [facility] Prenatal Labs  Dating LMP = 7 week [place] Blood type: A/Positive/-- (05/06 1544)   Genetic Screen NIPS: XY Inheritest: negative Antibody:Negative (05/06 1544)  Anatomic [place] Complete Rubella: 1.61 (05/06 1544) Varicella: Immune  GTT Third trimester: 87 RPR: Non Reactive (05/06 1544)   Rhogam N/A HBsAg: Negative (05/06 1544)   TDaP vaccine 05/22/21  Flu Shot: HIV: Non Reactive (05/06 1544)   Baby Food                                GBS:   Contraception  Pap:02/10/2019 NILM  CBB     CS/VBAC    Support Person                 Review of Systems: 10 point review of systems negative unless otherwise noted in HPI  Past Medical History: Patient Active Problem List   Diagnosis Date Noted   Labor and delivery indication for care or intervention 07/12/2021   Elevated blood pressure complicating pregnancy in third trimester, antepartum 07/12/2021   Supervision of normal pregnancy 12/16/2020    [facility] Prenatal Labs  Dating LMP = 7 week [place] Blood type: A/Positive/-- (05/06 1544)   Genetic Screen NIPS: XY Inheritest: negative Antibody:Negative (05/06 1544)  Anatomic [place]  Rubella: 1.61 (05/06 1544) Varicella: Immune  GTT Third trimester: 87 RPR: Non Reactive (05/06 1544)   Rhogam N/A HBsAg: Negative (05/06  1544)   TDaP vaccine 05/22/21  Flu Shot: HIV: Non Reactive (05/06 1544)   Baby Food                                GBS:   Contraception  Pap:02/10/2019 NILM  CBB     CS/VBAC    Support Person          Sphenoidal sinus polyp 08/09/2013    Past Surgical History: Past Surgical History:  Procedure Laterality Date   DILATION AND EVACUATION N/A 08/26/2019   Procedure: SUCTION D&C;  Surgeon: Vena Austria, MD;  Location: [facility] ORS;  Service: Gynecology;  Laterality: N/A;    Past Obstetric History: # 1 - Date: 2015, Sex: None, Weight: None, GA: None, Delivery: None, Apgar1: None, Apgar5: None, Living: None, Birth Comments: None  # 2 - Date: 07/2019, Sex: None, Weight: None, GA: None, Delivery: None, Apgar1: None, Apgar5: None, Living: None, Birth Comments: None  # 3 - Date: None, Sex: None, Weight: None, GA: None, Delivery: None, Apgar1: None, Apgar5: None, Living: None, Birth Comments: None   Past Gynecologic History:  Family History: Family History  Problem Relation Age of Onset   Anxiety disorder Mother    Hypertension Father  Heart attack Father    Breast cancer Maternal Grandmother 61       has contact   Prostate cancer Maternal Grandfather 60   Diabetes Maternal Grandfather    Heart failure Maternal Grandfather    Kidney disease Maternal Grandfather     Social History: Social History   Socioeconomic History   Marital status: Married    Spouse name: Not on file   Number of children: Not on file   Years of education: Not on file   Highest education level: Not on file  Occupational History   Not on file  Tobacco Use   Smoking status: Former   Smokeless tobacco: Never  Vaping Use   Vaping Use: Never used  Substance and Sexual Activity   Alcohol use: Not Currently    Comment: occ   Drug use: Never   Sexual activity: Yes    Birth control/protection: None  Other Topics Concern   Not on file  Social History Narrative   Not on file   Social Determinants  of Health   Financial Resource Strain: Not on file  Food Insecurity: Not on file  Transportation Needs: Not on file  Physical Activity: Not on file  Stress: Not on file  Social Connections: Not on file  Intimate Partner Violence: Not on file    Medications: Prior to Admission medications   Medication Sig Start Date End Date Taking? Authorizing Provider  Prenatal Vit-Fe Fumarate-FA (MULTIVITAMIN-PRENATAL) 27-0.8 MG TABS tablet Take 1 tablet by mouth daily at 12 noon.   Yes [provider]    Allergies: Allergies  Allergen Reactions   Amoxicillin Rash   Cefaclor Hives and Rash   Clarithromycin Rash   Penicillins Hives and Rash   Sulfamethoxazole-Trimethoprim Rash    Physical Exam: Vitals: Blood pressure 126/72, weight 189 lb (85.7 kg), last menstrual period 10/26/2020.  FHT: 145  General: NAD HEENT: normocephalic, anicteric Pulmonary: No increased work of breathing Cardiovascular: RRR, distal pulses 2+ Abdomen: Gravid, non-tender Leopolds:vtx Extremities: no edema, erythema, or tenderness Neurologic: Grossly intact Psychiatric: mood appropriate, affect full  Labs: No results found for this or any previous visit (from the past 24 hour(s)).  Assessment: 30 y.o. [id number] [redacted]w[redacted]d by 08/02/2021, by Last Menstrual Period ROB visit and IOL scheduling  Plan: 1) The ARRIVE study was a national multicenter trial that randomized 6,106 patients to induction of labor at 39 weeks 0 days to 39 weeks 4 days (3,062) compared to expectant management (3,044).  There was no significant difference in adverse perinatal outcomes but there was a significantly lower rate of cesarean delivery, as well as lower rate of maternal hypertensive complications in the induction group.  Number to treat was calculated as 28 induction of labor to prevent 1 primary Cesarean section.  "Labor Induction versus Expectant Management in Nulliparous Low-Risk Women" The [place] Journal of Medicine iAugust  9, 2018 Vol. 379 No. 6   2) Fetus - _FHT  3) PNL - Blood type A/Positive/-- (05/06 1544) / Anti-bodyscreen Negative (05/06 1544) / Rubella 1.61 (05/06 1544) / Varicella Immune / RPR Non Reactive (10/13 0840) / HBsAg Negative (05/06 1544) / HIV Non Reactive (10/13 0840) / 1-hr OGTT 87 / GBS Positive/-- (12/09 1052)  4) Immunization History -  Immunization History  Administered Date(s) Administered   Influenza, Seasonal, Injecte, Preservative Fre 04/13/2018   Influenza,inj,Quad PF,6+ Mos 04/12/2019, 06/06/2021   Influenza-Unspecified 05/12/2020   PFIZER(Purple Top)SARS-COV-2 Vaccination 03/27/2020, 04/18/2020   Tdap 05/22/2021    5) Disposition -  ROB 1 week IOL 07/28/21  Vena Austria, MD, [facility], [facility] 07/22/2021, 9:49 PM

## 2021-07-17 NOTE — Progress Notes (Signed)
ROB - no concerns. RM 4 °

## 2021-07-25 ENCOUNTER — Other Ambulatory Visit: Payer: Self-pay

## 2021-07-25 ENCOUNTER — Ambulatory Visit (INDEPENDENT_AMBULATORY_CARE_PROVIDER_SITE_OTHER): Payer: 59 | Admitting: Obstetrics and Gynecology

## 2021-07-25 ENCOUNTER — Inpatient Hospital Stay
Admission: EM | Admit: 2021-07-25 | Discharge: 2021-07-28 | DRG: 805 | Disposition: A | Discharge status: Home / Self Care | Payer: 59 | Attending: Obstetrics and Gynecology | Admitting: Obstetrics and Gynecology

## 2021-07-25 ENCOUNTER — Encounter: Payer: Self-pay | Admitting: Obstetrics and Gynecology

## 2021-07-25 VITALS — BP 134/74 | HR 124 | Wt 191.2 lb

## 2021-07-25 DIAGNOSIS — Z87891 Personal history of nicotine dependence: Secondary | ICD-10-CM | POA: Diagnosis not present

## 2021-07-25 DIAGNOSIS — O2662 Liver and biliary tract disorders in childbirth: Secondary | ICD-10-CM | POA: Diagnosis present

## 2021-07-25 DIAGNOSIS — O9882 Other maternal infectious and parasitic diseases complicating childbirth: Secondary | ICD-10-CM | POA: Diagnosis not present

## 2021-07-25 DIAGNOSIS — B951 Streptococcus, group B, as the cause of diseases classified elsewhere: Secondary | ICD-10-CM | POA: Diagnosis not present

## 2021-07-25 DIAGNOSIS — Z20822 Contact with and (suspected) exposure to covid-19: Secondary | ICD-10-CM | POA: Diagnosis present

## 2021-07-25 DIAGNOSIS — O99824 Streptococcus B carrier state complicating childbirth: Secondary | ICD-10-CM | POA: Diagnosis present

## 2021-07-25 DIAGNOSIS — Z3A38 38 weeks gestation of pregnancy: Secondary | ICD-10-CM | POA: Diagnosis not present

## 2021-07-25 DIAGNOSIS — K831 Obstruction of bile duct: Secondary | ICD-10-CM | POA: Diagnosis not present

## 2021-07-25 DIAGNOSIS — Z3490 Encounter for supervision of normal pregnancy, unspecified, unspecified trimester: Secondary | ICD-10-CM | POA: Diagnosis present

## 2021-07-25 DIAGNOSIS — Z3A39 39 weeks gestation of pregnancy: Secondary | ICD-10-CM | POA: Diagnosis not present

## 2021-07-25 DIAGNOSIS — O163 Unspecified maternal hypertension, third trimester: Secondary | ICD-10-CM

## 2021-07-25 DIAGNOSIS — O134 Gestational [pregnancy-induced] hypertension without significant proteinuria, complicating childbirth: Principal | ICD-10-CM | POA: Diagnosis present

## 2021-07-25 DIAGNOSIS — Z3403 Encounter for supervision of normal first pregnancy, third trimester: Secondary | ICD-10-CM

## 2021-07-25 LAB — POCT URINALYSIS DIPSTICK OB
Glucose, UA: NEGATIVE
POC,PROTEIN,UA: NEGATIVE

## 2021-07-25 LAB — COMPREHENSIVE METABOLIC PANEL
ALT: 62 U/L — ABNORMAL HIGH (ref 0–44)
AST: 41 U/L (ref 15–41)
Albumin: 3 g/dL — ABNORMAL LOW (ref 3.5–5.0)
Alkaline Phosphatase: 259 U/L — ABNORMAL HIGH (ref 38–126)
Anion gap: 8 (ref 5–15)
BUN: 12 mg/dL (ref 6–20)
CO2: 21 mmol/L — ABNORMAL LOW (ref 22–32)
Calcium: 9 mg/dL (ref 8.9–10.3)
Chloride: 103 mmol/L (ref 98–111)
Creatinine, Ser: 0.47 mg/dL (ref 0.44–1.00)
GFR, Estimated: >60 mL/min (ref >60)
Glucose, Bld: 109 mg/dL — ABNORMAL HIGH (ref 70–99)
Potassium: 3.7 mmol/L (ref 3.5–5.1)
Sodium: 132 mmol/L — ABNORMAL LOW (ref 135–145)
Total Bilirubin: 0.8 mg/dL (ref 0.3–1.2)
Total Protein: 6.7 g/dL (ref 6.5–8.1)

## 2021-07-25 LAB — CBC
HCT: 34 % — ABNORMAL LOW (ref 36.0–46.0)
Hemoglobin: 11.7 g/dL — ABNORMAL LOW (ref 12.0–15.0)
MCH: 29 pg (ref 26.0–34.0)
MCHC: 34.4 g/dL (ref 30.0–36.0)
MCV: 84.2 fL (ref 80.0–100.0)
Platelets: 216 10*3/uL (ref 150–400)
RBC: 4.04 MIL/uL (ref 3.87–5.11)
RDW: 13.1 % (ref 11.5–15.5)
WBC: 7.6 10*3/uL (ref 4.0–10.5)
nRBC: 0 % (ref 0.0–0.2)

## 2021-07-25 LAB — PROTEIN / CREATININE RATIO, URINE
Creatinine, Urine: 78 mg/dL
Protein Creatinine Ratio: 0.17 mg/mg{Cre} — ABNORMAL HIGH (ref 0.00–0.15)
Total Protein, Urine: 13 mg/dL

## 2021-07-25 LAB — TYPE AND SCREEN
ABO/RH(D): A POS
Antibody Screen: NEGATIVE

## 2021-07-25 LAB — RESP PANEL BY RT-PCR (FLU A&B, COVID) ARPGX2
Influenza A by PCR: NEGATIVE
Influenza B by PCR: NEGATIVE
SARS Coronavirus 2 by RT PCR: NEGATIVE

## 2021-07-25 MED ORDER — ACETAMINOPHEN 325 MG PO TABS: 650.0000 mg | ORAL_TABLET | ORAL | Status: DC | PRN

## 2021-07-25 MED ORDER — ONDANSETRON HCL 4 MG/2ML IJ SOLN
4.0000 mg | Freq: Four times a day (QID) | INTRAMUSCULAR | Status: DC | PRN
  Administered 2021-07-26: 09:00:00 4 mg via INTRAVENOUS
  Filled 2021-07-25: qty 2

## 2021-07-25 MED ORDER — LACTATED RINGERS IV SOLN: INTRAVENOUS | Status: DC

## 2021-07-25 MED ORDER — LIDOCAINE HCL (PF) 1 % IJ SOLN: 30.0000 mL | INTRAMUSCULAR | Status: DC | PRN

## 2021-07-25 MED ORDER — VANCOMYCIN HCL IN DEXTROSE 1-5 GM/200ML-% IV SOLN
1000.0000 mg | Freq: Two times a day (BID) | INTRAVENOUS | Status: DC
  Administered 2021-07-26 (×2): 1000 mg via INTRAVENOUS
  Filled 2021-07-25 (×2): qty 200

## 2021-07-25 MED ORDER — BUTORPHANOL TARTRATE 1 MG/ML IJ SOLN
1.0000 mg | INTRAMUSCULAR | Status: DC | PRN
  Administered 2021-07-26 (×2): 1 mg via INTRAVENOUS
  Filled 2021-07-25 (×2): qty 1

## 2021-07-25 MED ORDER — OXYTOCIN BOLUS FROM INFUSION
333.0000 mL | Freq: Once | INTRAVENOUS | Status: AC
  Administered 2021-07-26: 20:00:00 333 mL via INTRAVENOUS

## 2021-07-25 MED ORDER — SOD CITRATE-CITRIC ACID 500-334 MG/5ML PO SOLN: 30.0000 mL | ORAL | Status: DC | PRN

## 2021-07-25 MED ORDER — MISOPROSTOL 25 MCG QUARTER TABLET
25.0000 ug | ORAL_TABLET | ORAL | Status: DC | PRN
  Administered 2021-07-25 – 2021-07-26 (×3): 25 ug via VAGINAL
  Filled 2021-07-25 (×4): qty 1

## 2021-07-25 MED ORDER — OXYTOCIN-SODIUM CHLORIDE 30-0.9 UT/500ML-% IV SOLN
2.5000 [IU]/h | INTRAVENOUS | Status: DC
  Administered 2021-07-26: 20:00:00 2.5 [IU]/h via INTRAVENOUS
  Filled 2021-07-25: qty 500

## 2021-07-25 MED ORDER — TERBUTALINE SULFATE 1 MG/ML IJ SOLN: 0.2500 mg | Freq: Once | INTRAMUSCULAR | Status: DC | PRN

## 2021-07-25 MED ORDER — LACTATED RINGERS IV SOLN: 500.0000 mL | INTRAVENOUS | Status: DC | PRN

## 2021-07-25 NOTE — H&P (Signed)
Carol Steele is a 30 y.o. female presenting for Induction of labor.she was seen at the [facility] office today, with c/o feeling lightheaded and having several days of itching hands and feet. She had been set up for elective IOL on the 30th, but it was decided today, that in light of her feeling dizzy and with concern for possible cholestasis, her induction would commence this evening. She presents with her husband who is supportive. OB History     Gravida  3   Para      Term      Preterm      AB  2   Living         SAB  2   IAB      Ectopic      Multiple      Live Births             Past Medical History:  Diagnosis Date   Anxiety 2020   Past Surgical History:  Procedure Laterality Date   DILATION AND EVACUATION N/A 08/26/2019   Procedure: SUCTION D&C;  Surgeon: Vena Austria, MD;  Location: [facility] ORS;  Service: Gynecology;  Laterality: N/A;   Family History: family history includes Anxiety disorder in her mother; Breast cancer (age of onset: 64) in her maternal grandmother; Diabetes in her maternal grandfather; Heart attack in her father; Heart failure in her maternal grandfather; Hypertension in her father; Kidney disease in her maternal grandfather; Prostate cancer (age of onset: 64) in her maternal grandfather. Social History:  reports that she has quit smoking. She has never used smokeless tobacco. She reports that she does not currently use alcohol. She reports that she does not use drugs.     Maternal Diabetes: No Genetic Screening: Normal Maternal Ultrasounds/Referrals: Normal Fetal Ultrasounds or other Referrals:  None Maternal Substance Abuse:  No Significant Maternal Medications:  None Significant Maternal Lab Results:  Group B Strep positive Other Comments:  None  Review of Systems  Constitutional: Negative.   HENT: Negative.    Eyes: Negative.   Respiratory: Negative.    Cardiovascular: Negative.   Gastrointestinal: Negative.   Endocrine:  Negative.   Genitourinary: Negative.   Musculoskeletal: Negative.   Neurological: Negative.   Hematological: Negative.   Psychiatric/Behavioral:  The patient is nervous/anxious.   History Dilation: 1 Effacement (%): 20 Station: -3 Exam by:: Paula Compton CNM Blood pressure (!) 139/94, pulse (!) 117, temperature 98.4 F (36.9 C), temperature source Oral, resp. rate 16, height 5\' 5"  (1.651 m), weight 86.6 kg, last menstrual period 10/26/2020. Maternal Exam:  Introitus: Normal vulva.  Physical Exam Constitutional:      Appearance: Normal appearance. She is obese.  HENT:     Head: Normocephalic and atraumatic.  Cardiovascular:     Rate and Rhythm: Normal rate and regular rhythm.     Pulses: Normal pulses.     Heart sounds: Normal heart sounds.  Pulmonary:     Effort: Pulmonary effort is normal.     Breath sounds: Normal breath sounds.  Abdominal:     General: Bowel sounds are normal.     Comments: Gravid uterus  Genitourinary:    General: Normal vulva.     Rectum: Normal.  Musculoskeletal:        General: Normal range of motion.     Cervical back: Normal range of motion and neck supple.  Skin:    General: Skin is warm and dry.  Neurological:     General: No focal deficit present.  Mental Status: She is oriented to person, place, and time.  Psychiatric:        Mood and Affect: Mood normal.        Behavior: Behavior normal.     Comments: Hx of anxiety. Previous use of Lexapro.    Prenatal labs: ABO, Rh: --/--/A POS (12/28 1829) Antibody: NEG (12/28 1829) Rubella: 1.61 (05/06 1544) RPR: Non Reactive (10/13 0840)  HBsAg: Negative (05/06 1544)  HIV: Non Reactive (10/13 0840)  GBS: Positive/-- (12/09 1052)   Assessment/Plan: IUP 38 weeks 6 days IOL for possible cholestasis Bishop score is 2. Will commence with cervical ripening using Cytotec 25 mcg vaginally Routine admission orders and labs. IV access. Continuous EFM. Cytotec placed at 2000. Repeat q 4-6  hours. Anticipate use of foley ball or pitocin. Patient plans on an epidural. Vancomycin for GBS prophylaxis   Mirna Mires 07/25/2021, 8:22 PM

## 2021-07-25 NOTE — Progress Notes (Signed)
Routine Prenatal Care Visit  Subjective  Carol Steele is a 30 y.o. G3P0020 at [redacted]w[redacted]d being seen today for ongoing prenatal care.  She is currently monitored for the following issues for this high-risk pregnancy and has Sphenoidal sinus polyp; Supervision of normal pregnancy; Labor and delivery indication for care or intervention; and Elevated blood pressure complicating pregnancy in third trimester, antepartum on their problem list.  ----------------------------------------------------------------------------------- Patient reports  she has been feeling "off" for several days . She has been having a high heart rate at home.    She reports itching of her hands and palms.   Contractions: Irritability. Vag. Bleeding: None.  Movement: Present. Denies leaking of fluid.  ----------------------------------------------------------------------------------- The following portions of the patient's history were reviewed and updated as appropriate: allergies, current medications, past family history, past medical history, past social history, past surgical history and problem list. Problem list updated.   Objective  Blood pressure 134/74, weight 191 lb 3.2 oz (86.7 kg), last menstrual period 10/26/2020. Pregravid weight 150 lb (68 kg) Total Weight Gain 41 lb 3.2 oz (18.7 kg) Urinalysis:      Fetal Status: Fetal Heart Rate (bpm): 150 Fundal Height: 38 cm Movement: Present     General:  Alert, oriented and cooperative. Patient is in no acute distress.  Skin: Skin is warm and dry. No rash noted.   Cardiovascular: Normal heart rate noted  Respiratory: Normal respiratory effort, no problems with respiration noted  Abdomen: Soft, gravid, appropriate for gestational age. Pain/Pressure: Present     Pelvic:  Cervical exam deferred        Extremities: Normal range of motion.     Mental Status: Normal mood and affect. Normal behavior. Normal judgment and thought content.     Assessment   30 y.o. [id number]  at [redacted]w[redacted]d by  08/02/2021, by Last Menstrual Period presenting for routine prenatal visit  Plan   Pregnancy 3 Problems (from 12/01/20 to present)     Problem Noted Resolved   Elevated blood pressure complicating pregnancy in third trimester, antepartum 07/12/2021 by Nadara Mustard, MD No   Supervision of normal pregnancy 12/16/2020 by Vena Austria, MD No   Overview Addendum 05/29/2021  9:07 AM by Vena Austria, MD    Clinic [facility] Prenatal Labs  Dating LMP = 7 week [place] Blood type: A/Positive/-- (05/06 1544)   Genetic Screen NIPS: XY Inheritest: negative Antibody:Negative (05/06 1544)  Anatomic [place] complete Rubella: 1.61 (05/06 1544) Varicella: Immune  GTT Third trimester: 87 RPR: Non Reactive (05/06 1544)   Rhogam N/A HBsAg: Negative (05/06 1544)   TDaP vaccine 05/22/21  Flu Shot: HIV: Non Reactive (05/06 1544)   Baby Food                                GBS:   Contraception  Pap:02/10/2019 NILM  CBB     CS/VBAC    Support Person                Will send to L&D for evaluation. Suspicion for cholestasis of pregnancy. Will proceed with induction given advanced gestational age and symptoms. Discussed that bile acid lab results can take several days.   Gestational age appropriate obstetric precautions including but not limited to vaginal bleeding, contractions, leaking of fluid and fetal movement were reviewed in detail with the patient.    Return if symptoms worsen or fail to improve.  Natale Milch MD [facility],  [facility] 07/25/2021, 4:50 PM

## 2021-07-25 NOTE — Patient Instructions (Signed)
Labor Induction ?Labor induction is when steps are taken to cause a pregnant woman to begin the labor process. Most women go into labor on their own between 37 weeks and 42 weeks of pregnancy. When this does not happen, or when there is a medical need for labor to begin, steps may be taken to induce, or bring on, labor. ?Labor induction causes a pregnant woman's uterus to contract. It also causes the cervix to soften (ripen), open (dilate), and thin out. Usually, labor is not induced before 39 weeks of pregnancy unless there is a medical reason to do so. ?When is labor induction considered? ?Labor induction may be right for you if: ?Your pregnancy lasts longer than 41 to 42 weeks. ?Your placenta is separating from your uterus (placental abruption). ?You have a rupture of membranes and your labor does not begin. ?You have health problems, like diabetes or high blood pressure (preeclampsia) during your pregnancy. ?Your baby has stopped growing or does not have enough amniotic fluid. ?Before labor induction begins, your health care provider will consider the following factors: ?Your medical condition and the baby's condition. ?How many weeks you have been pregnant. ?How mature the baby's lungs are. ?The condition of your cervix. ?The position of the baby. ?The size of your birth canal. ?Tell a health care provider about: ?Any allergies you have. ?All medicines you are taking, including vitamins, herbs, eye drops, creams, and over-the-counter medicines. ?Any problems you or your family members have had with anesthetic medicines. ?Any surgeries you have had. ?Any blood disorders you have. ?Any medical conditions you have. ?What are the risks? ?Generally, this is a safe procedure. However, problems may occur, including: ?Failed induction. ?Changes in fetal heart rate, such as being too high, too low, or irregular (erratic). ?Infection in the mother or the baby. ?Increased risk of having a cesarean delivery. ?Breaking off  (abruption) of the placenta from the uterus. This is rare. ?Rupture of the uterus. This is very rare. ?Your baby could fail to get enough blood flow or oxygen. This can be life-threatening. ?When induction is needed for medical reasons, the benefits generally outweigh the risks. ?What happens during the procedure? ?During the procedure, your health care provider will use one of these methods to induce labor: ?Stripping the membranes. In this method, the amniotic sac tissue is gently separated from the cervix. This causes the following to happen: ?Your cervix stretches, which in turn causes the release of prostaglandins. ?Prostaglandins induce labor and cause the uterus to contract. ?This procedure is often done in an office visit. You will be sent home to wait for contractions to begin. ?Prostaglandin medicine. This medicine starts contractions and causes the cervix to dilate and ripen. This can be taken by mouth (orally) or by being inserted into the vagina (suppository). ?Inserting a small, thin tube (catheter) with a balloon into the vagina and then expanding the balloon with water to dilate the cervix. ?Breaking the water. In this method, a small instrument is used to make a small hole in the amniotic sac. This eventually causes the amniotic sac to break. Contractions should begin within a few hours. ?Medicine to trigger or strengthen contractions. This medicine is given through an IV that is inserted into a vein in your arm. ?This procedure may vary among health care providers and hospitals. ?Where to find more information ?March of Dimes: www.marchofdimes.org ?The American College of Obstetricians and Gynecologists: www.acog.org ?Summary ?Labor induction causes a pregnant woman's uterus to contract. It also causes the cervix   to soften (ripen), open (dilate), and thin out. ?Labor is usually not induced before 39 weeks of pregnancy unless there is a medical reason to do so. ?When induction is needed for medical  reasons, the benefits generally outweigh the risks. ?Talk with your health care provider about which methods of labor induction are right for you. ?This information is not intended to replace advice given to you by your health care provider. Make sure you discuss any questions you have with your health care provider. ?Document Revised: 04/27/2020 Document Reviewed: 04/27/2020 ?Elsevier Patient Education ? 2022 Elsevier Inc. ? ?

## 2021-07-26 ENCOUNTER — Inpatient Hospital Stay: Payer: 59 | Admitting: Anesthesiology

## 2021-07-26 ENCOUNTER — Encounter: Payer: Self-pay | Admitting: Obstetrics and Gynecology

## 2021-07-26 DIAGNOSIS — B951 Streptococcus, group B, as the cause of diseases classified elsewhere: Secondary | ICD-10-CM

## 2021-07-26 DIAGNOSIS — O9882 Other maternal infectious and parasitic diseases complicating childbirth: Secondary | ICD-10-CM

## 2021-07-26 DIAGNOSIS — O134 Gestational [pregnancy-induced] hypertension without significant proteinuria, complicating childbirth: Principal | ICD-10-CM

## 2021-07-26 DIAGNOSIS — K831 Obstruction of bile duct: Secondary | ICD-10-CM

## 2021-07-26 DIAGNOSIS — Z3A38 38 weeks gestation of pregnancy: Secondary | ICD-10-CM

## 2021-07-26 DIAGNOSIS — O2662 Liver and biliary tract disorders in childbirth: Secondary | ICD-10-CM

## 2021-07-26 LAB — RPR: RPR Ser Ql: NONREACTIVE

## 2021-07-26 MED ORDER — IBUPROFEN 600 MG PO TABS
600.0000 mg | ORAL_TABLET | Freq: Four times a day (QID) | ORAL | Status: DC
  Administered 2021-07-26 – 2021-07-28 (×7): 600 mg via ORAL
  Filled 2021-07-26 (×7): qty 1

## 2021-07-26 MED ORDER — BUPIVACAINE HCL (PF) 0.25 % IJ SOLN
INTRAMUSCULAR | Status: DC | PRN
  Administered 2021-07-26: 8 mL via EPIDURAL

## 2021-07-26 MED ORDER — MISOPROSTOL 200 MCG PO TABS
ORAL_TABLET | ORAL | Status: AC
  Filled 2021-07-26: qty 4

## 2021-07-26 MED ORDER — DIPHENHYDRAMINE HCL 50 MG/ML IJ SOLN
INTRAMUSCULAR | Status: AC
  Administered 2021-07-26: 01:00:00 25 mg via INTRAVENOUS
  Filled 2021-07-26: qty 1

## 2021-07-26 MED ORDER — FENTANYL-BUPIVACAINE-NACL 0.5-0.125-0.9 MG/250ML-% EP SOLN
EPIDURAL | Status: AC
  Filled 2021-07-26: qty 250

## 2021-07-26 MED ORDER — SENNOSIDES-DOCUSATE SODIUM 8.6-50 MG PO TABS
2.0000 | ORAL_TABLET | ORAL | Status: DC
  Administered 2021-07-26 – 2021-07-27 (×2): 2 via ORAL
  Filled 2021-07-26 (×2): qty 2

## 2021-07-26 MED ORDER — TERBUTALINE SULFATE 1 MG/ML IJ SOLN: 0.2500 mg | Freq: Once | INTRAMUSCULAR | Status: DC | PRN

## 2021-07-26 MED ORDER — DIPHENHYDRAMINE HCL 50 MG/ML IJ SOLN
12.5000 mg | INTRAMUSCULAR | Status: DC | PRN
  Administered 2021-07-26: 12:00:00 12.5 mg via INTRAVENOUS
  Filled 2021-07-26: qty 1

## 2021-07-26 MED ORDER — OXYTOCIN 10 UNIT/ML IJ SOLN
INTRAMUSCULAR | Status: AC
  Filled 2021-07-26: qty 2

## 2021-07-26 MED ORDER — ONDANSETRON HCL 4 MG/2ML IJ SOLN: 4.0000 mg | INTRAMUSCULAR | Status: DC | PRN

## 2021-07-26 MED ORDER — SIMETHICONE 80 MG PO CHEW: 80.0000 mg | CHEWABLE_TABLET | ORAL | Status: DC | PRN

## 2021-07-26 MED ORDER — DIPHENHYDRAMINE HCL 50 MG/ML IJ SOLN: 25.0000 mg | Freq: Once | INTRAMUSCULAR | Status: AC

## 2021-07-26 MED ORDER — LACTATED RINGERS IV SOLN
500.0000 mL | Freq: Once | INTRAVENOUS | Status: AC
  Administered 2021-07-26: 12:00:00 500 mL via INTRAVENOUS

## 2021-07-26 MED ORDER — ONDANSETRON HCL 4 MG PO TABS: 4.0000 mg | ORAL_TABLET | ORAL | Status: DC | PRN

## 2021-07-26 MED ORDER — EPHEDRINE 5 MG/ML INJ: 10.0000 mg | INTRAVENOUS | Status: DC | PRN

## 2021-07-26 MED ORDER — DIPHENHYDRAMINE HCL 50 MG/ML IJ SOLN
INTRAMUSCULAR | Status: AC
  Administered 2021-07-26: 18:00:00 50 mg via INTRAVENOUS
  Filled 2021-07-26: qty 1

## 2021-07-26 MED ORDER — WITCH HAZEL-GLYCERIN EX PADS
1.0000 "application " | MEDICATED_PAD | CUTANEOUS | Status: DC | PRN
  Administered 2021-07-26: 1 via TOPICAL
  Filled 2021-07-26: qty 100

## 2021-07-26 MED ORDER — FENTANYL-BUPIVACAINE-NACL 0.5-0.125-0.9 MG/250ML-% EP SOLN
12.0000 mL/h | EPIDURAL | Status: DC | PRN
  Administered 2021-07-26: 12:00:00 12 mL/h via EPIDURAL

## 2021-07-26 MED ORDER — DIBUCAINE (PERIANAL) 1 % EX OINT: 1.0000 "application " | TOPICAL_OINTMENT | CUTANEOUS | Status: DC | PRN

## 2021-07-26 MED ORDER — COCONUT OIL OIL
1.0000 "application " | TOPICAL_OIL | Status: DC | PRN
  Filled 2021-07-26 (×2): qty 120

## 2021-07-26 MED ORDER — PHENYLEPHRINE 40 MCG/ML (10ML) SYRINGE FOR IV PUSH (FOR BLOOD PRESSURE SUPPORT): 80.0000 ug | PREFILLED_SYRINGE | INTRAVENOUS | Status: DC | PRN

## 2021-07-26 MED ORDER — OXYCODONE-ACETAMINOPHEN 5-325 MG PO TABS: 2.0000 | ORAL_TABLET | ORAL | Status: DC | PRN

## 2021-07-26 MED ORDER — AMMONIA AROMATIC IN INHA
RESPIRATORY_TRACT | Status: AC
  Filled 2021-07-26: qty 10

## 2021-07-26 MED ORDER — BENZOCAINE-MENTHOL 20-0.5 % EX AERO
1.0000 "application " | INHALATION_SPRAY | CUTANEOUS | Status: DC | PRN
  Administered 2021-07-26: 1 via TOPICAL
  Filled 2021-07-26: qty 56

## 2021-07-26 MED ORDER — PRENATAL MULTIVITAMIN CH
1.0000 | ORAL_TABLET | Freq: Every day | ORAL | Status: DC
  Administered 2021-07-27 – 2021-07-28 (×2): 1 via ORAL
  Filled 2021-07-26 (×2): qty 1

## 2021-07-26 MED ORDER — DIPHENHYDRAMINE HCL 25 MG PO CAPS: 25.0000 mg | ORAL_CAPSULE | Freq: Four times a day (QID) | ORAL | Status: DC | PRN

## 2021-07-26 MED ORDER — ACETAMINOPHEN 325 MG PO TABS
650.0000 mg | ORAL_TABLET | ORAL | Status: DC | PRN
  Administered 2021-07-26 – 2021-07-28 (×5): 650 mg via ORAL
  Filled 2021-07-26 (×5): qty 2

## 2021-07-26 MED ORDER — LIDOCAINE-EPINEPHRINE (PF) 1.5 %-1:200000 IJ SOLN
INTRAMUSCULAR | Status: DC | PRN
  Administered 2021-07-26: 3 mL via EPIDURAL

## 2021-07-26 MED ORDER — DIPHENHYDRAMINE HCL 50 MG/ML IJ SOLN: 50.0000 mg | Freq: Once | INTRAMUSCULAR | Status: AC

## 2021-07-26 MED ORDER — DIPHENHYDRAMINE HCL 50 MG/ML IJ SOLN: 50.0000 mg | Freq: Once | INTRAMUSCULAR | Status: DC

## 2021-07-26 MED ORDER — OXYTOCIN-SODIUM CHLORIDE 30-0.9 UT/500ML-% IV SOLN
1.0000 m[IU]/min | INTRAVENOUS | Status: DC
Start: 2021-07-26 — End: 2021-07-26
  Administered 2021-07-26: 12:00:00 2 m[IU]/min via INTRAVENOUS

## 2021-07-26 MED ORDER — OXYCODONE-ACETAMINOPHEN 5-325 MG PO TABS: 1.0000 | ORAL_TABLET | ORAL | Status: DC | PRN

## 2021-07-26 MED ORDER — LIDOCAINE HCL (PF) 1 % IJ SOLN
INTRAMUSCULAR | Status: AC
  Filled 2021-07-26: qty 30

## 2021-07-26 NOTE — Anesthesia Preprocedure Evaluation (Addendum)
Anesthesia Evaluation  Patient identified by MRN, date of birth, ID band Patient awake    Reviewed: Allergy & Precautions, NPO status , Patient's Chart, lab work & pertinent test results  History of Anesthesia Complications Negative for: history of anesthetic complications  Airway Mallampati: II   Neck ROM: Full    Dental no notable dental hx.    Pulmonary former smoker,    Pulmonary exam normal breath sounds clear to auscultation       Cardiovascular hypertension (gestational), Normal cardiovascular exam Rhythm:Regular Rate:Normal     Neuro/Psych negative neurological ROS     GI/Hepatic GERD (with pregnancy)  ,  Endo/Other  negative endocrine ROS  Renal/GU negative Renal ROS     Musculoskeletal   Abdominal   Peds  Hematology negative hematology ROS (+)   Anesthesia Other Findings 30 yo G3P0 at 23 0/7 for IOL with gHTN and suspected cholestasis of pregnancy desiring labor epidural.  Reproductive/Obstetrics                            Anesthesia Physical Anesthesia Plan  ASA: 2  Anesthesia Plan: Epidural   Post-op Pain Management:    Induction:   PONV Risk Score and Plan: 2 and Treatment may vary due to age or medical condition  Airway Management Planned: Natural Airway  Additional Equipment:   Intra-op Plan:   Post-operative Plan:   Informed Consent: I have reviewed the patients History and Physical, chart, labs and discussed the procedure including the risks, benefits and alternatives for the proposed anesthesia with the patient or authorized representative who has indicated his/her understanding and acceptance.     Dental Advisory Given  Plan Discussed with:   Anesthesia Plan Comments: (Patient reports no bleeding problems and no anticoagulant use.   Patient consented for risks of anesthesia including but not limited to:  - adverse reactions to medications - risk  of bleeding, infection and or nerve damage from epidural that could lead to paralysis - risk of headache or failed epidural - nerve damage due to positioning - that if epidural is used for C-section that there is a chance of epidural failure requiring spinal placement or conversion to GA - Damage to heart, brain, lungs, other parts of body or loss of life  Patient voiced understanding.)        Anesthesia Quick Evaluation

## 2021-07-26 NOTE — Progress Notes (Signed)
Subjective:  Painfully contracting, no headaches, vision changes, RUQ or epigastric pain  Objective:   Vitals: Blood pressure (!) 144/92, pulse 95, temperature 98.2 F (36.8 C), temperature source Oral, resp. rate 18, height 5\' 5"  (1.651 m), weight 86.6 kg, last menstrual period 10/26/2020. General: Painfully contracting Abdomen: NAD Cervical Exam:  Dilation: 1.5 Effacement (%): 70 Cervical Position: Middle Station: -2 Presentation: Vertex Exam by:: Autie Vasudevan MD  FHT: 135, moderate, +accels, no decels Toco: q2-31min  Results for orders placed or performed during the hospital encounter of 07/25/21 (from the past 24 hour(s))  Resp Panel by RT-PCR (Flu A&B, Covid) Nasopharyngeal Swab     Status: None   Collection Time: 07/25/21  6:29 PM   Specimen: Nasopharyngeal Swab; Nasopharyngeal(NP) swabs in vial transport medium  Result Value Ref Range   SARS Coronavirus 2 by RT PCR NEGATIVE NEGATIVE   Influenza A by PCR NEGATIVE NEGATIVE   Influenza B by PCR NEGATIVE NEGATIVE  CBC     Status: Abnormal   Collection Time: 07/25/21  6:29 PM  Result Value Ref Range   WBC 7.6 4.0 - 10.5 K/uL   RBC 4.04 3.87 - 5.11 MIL/uL   Hemoglobin 11.7 (L) 12.0 - 15.0 g/dL   HCT 07/27/21 (L) 32.3 - 55.7 %   MCV 84.2 80.0 - 100.0 fL   MCH 29.0 26.0 - 34.0 pg   MCHC 34.4 30.0 - 36.0 g/dL   RDW 32.2 02.5 - 42.7 %   Platelets 216 150 - 400 K/uL   nRBC 0.0 0.0 - 0.2 %  Type and screen     Status: None   Collection Time: 07/25/21  6:29 PM  Result Value Ref Range   ABO/RH(D) A POS    Antibody Screen NEG    Sample Expiration      07/28/2021,2359 Performed at [facility], [address] [place], [place]   Protein / creatinine ratio, urine     Status: Abnormal   Collection Time: 07/25/21  6:29 PM  Result Value Ref Range   Creatinine, Urine 78 mg/dL   Total Protein, Urine 13 mg/dL   Protein Creatinine Ratio 0.17 (H) 0.00 - 0.15 mg/mg[Cre]  Comprehensive metabolic panel     Status:  Abnormal   Collection Time: 07/25/21  6:29 PM  Result Value Ref Range   Sodium 132 (L) 135 - 145 mmol/L   Potassium 3.7 3.5 - 5.1 mmol/L   Chloride 103 98 - 111 mmol/L   CO2 21 (L) 22 - 32 mmol/L   Glucose, Bld 109 (H) 70 - 99 mg/dL   BUN 12 6 - 20 mg/dL   Creatinine, Ser 07/27/21 0.44 - 1.00 mg/dL   Calcium 9.0 8.9 - 3.15 mg/dL   Total Protein 6.7 6.5 - 8.1 g/dL   Albumin 3.0 (L) 3.5 - 5.0 g/dL   AST 41 15 - 41 U/L   ALT 62 (H) 0 - 44 U/L   Alkaline Phosphatase 259 (H) 38 - 126 U/L   Total Bilirubin 0.8 0.3 - 1.2 mg/dL   GFR, Estimated 17.6 >16 mL/min   Anion gap 8 5 - 15    Assessment:   30 y.o. G3P0020 [redacted]w[redacted]d IOL GHTN, suspected cholestasis of pregnancy  Plan:   1) Labor -SROM this morning, contracting to much for additional cytotec will see if any further change on next check.  Anticipate switching to pitocin  2) Fetus - cat I tracing  3) GHTN - mild range BP normal labs other than mildly  elevated ALT  3) Suspected cholestasis of pregnancy - bile acids pending but given pruritus as well as mild ALT elevation high suspicion   Vena Austria, MD, [facility], [facility] 07/26/2021, 9:15 AM

## 2021-07-26 NOTE — Anesthesia Procedure Notes (Signed)
Epidural Patient location during procedure: OB Start time: 07/26/2021 11:35 AM End time: 07/26/2021 11:56 AM  Staffing Anesthesiologist: Reed Breech, MD Performed: anesthesiologist   Preanesthetic Checklist Completed: patient identified, IV checked, risks and benefits discussed, surgical consent, monitors and equipment checked, pre-op evaluation and timeout performed  Epidural Patient position: sitting Prep: Betadine Patient monitoring: heart rate, continuous pulse ox and blood pressure Approach: midline Location: L3-L4 Injection technique: LOR air  Needle:  Needle type: Tuohy  Needle gauge: 17 G Needle length: 9 cm Needle insertion depth: 5.5 cm Catheter at skin depth: 10.5 cm Test dose: negative  Assessment Sensory level: T4  Additional Notes Reason for block:procedure for pain

## 2021-07-26 NOTE — Progress Notes (Signed)
Subjective:  Feeling more pressure  Objective:   Vitals: Blood pressure 115/61, pulse 85, temperature 98.3 F (36.8 C), temperature source Oral, resp. rate 18, height 5\' 5"  (1.651 m), weight 86.6 kg, last menstrual period 10/26/2020, SpO2 97 %. General: NAD Abdomen: gravid, non-tender Cervical Exam:  Dilation: 7.5 Effacement (%): 90, 100 Cervical Position: Middle Station: 0, Plus 1 Presentation: Vertex Exam by:: [email or web address] RN  FHT: 135, moderate, +accels, no decels Toco: q1-75min  Results for orders placed or performed during the hospital encounter of 07/25/21 (from the past 24 hour(s))  Resp Panel by RT-PCR (Flu A&B, Covid) Nasopharyngeal Swab     Status: None   Collection Time: 07/25/21  6:29 PM   Specimen: Nasopharyngeal Swab; Nasopharyngeal(NP) swabs in vial transport medium  Result Value Ref Range   SARS Coronavirus 2 by RT PCR NEGATIVE NEGATIVE   Influenza A by PCR NEGATIVE NEGATIVE   Influenza B by PCR NEGATIVE NEGATIVE  CBC     Status: Abnormal   Collection Time: 07/25/21  6:29 PM  Result Value Ref Range   WBC 7.6 4.0 - 10.5 K/uL   RBC 4.04 3.87 - 5.11 MIL/uL   Hemoglobin 11.7 (L) 12.0 - 15.0 g/dL   HCT 07/27/21 (L) 01.7 - 49.4 %   MCV 84.2 80.0 - 100.0 fL   MCH 29.0 26.0 - 34.0 pg   MCHC 34.4 30.0 - 36.0 g/dL   RDW 49.6 75.9 - 16.3 %   Platelets 216 150 - 400 K/uL   nRBC 0.0 0.0 - 0.2 %  Type and screen     Status: None   Collection Time: 07/25/21  6:29 PM  Result Value Ref Range   ABO/RH(D) A POS    Antibody Screen NEG    Sample Expiration      07/28/2021,2359 Performed at [facility], [address] [place], [place]   RPR     Status: None   Collection Time: 07/25/21  6:29 PM  Result Value Ref Range   RPR Ser Ql NON REACTIVE NON REACTIVE  Protein / creatinine ratio, urine     Status: Abnormal   Collection Time: 07/25/21  6:29 PM  Result Value Ref Range   Creatinine, Urine 78 mg/dL   Total Protein, Urine 13 mg/dL   Protein  Creatinine Ratio 0.17 (H) 0.00 - 0.15 mg/mg[Cre]  Comprehensive metabolic panel     Status: Abnormal   Collection Time: 07/25/21  6:29 PM  Result Value Ref Range   Sodium 132 (L) 135 - 145 mmol/L   Potassium 3.7 3.5 - 5.1 mmol/L   Chloride 103 98 - 111 mmol/L   CO2 21 (L) 22 - 32 mmol/L   Glucose, Bld 109 (H) 70 - 99 mg/dL   BUN 12 6 - 20 mg/dL   Creatinine, Ser 07/27/21 0.44 - 1.00 mg/dL   Calcium 9.0 8.9 - 5.70 mg/dL   Total Protein 6.7 6.5 - 8.1 g/dL   Albumin 3.0 (L) 3.5 - 5.0 g/dL   AST 41 15 - 41 U/L   ALT 62 (H) 0 - 44 U/L   Alkaline Phosphatase 259 (H) 38 - 126 U/L   Total Bilirubin 0.8 0.3 - 1.2 mg/dL   GFR, Estimated 17.7 >93 mL/min   Anion gap 8 5 - 15    Assessment:   30 y.o. G3P0020 [redacted]w[redacted]d IOL GHTN and presumptive cholestasis of pregnancy  Plan:   1) Labor - good cervical change some cervix still remains patient right  2)  Fetus - cat I tracing  3) GHTN - normotensive since epidural  4) Cholestasis of pregnancy - bile acids pending  Vena Austria, MD, [facility], [facility] 07/26/2021, 6:20 PM

## 2021-07-26 NOTE — Discharge Summary (Signed)
OB Discharge Summary     Patient Name: Carol Steele DOB: [birth year] MRN: [id number]  Date of admission: 07/25/2021 Delivering MD: Vena Austria   Date of discharge: 07/28/2021  Admitting diagnosis: Encounter for elective induction of labor [Z34.90] Intrauterine pregnancy: [redacted]w[redacted]d     Secondary diagnosis:  Principal Problem:   Encounter for elective induction of labor  Additional problems:  Gestational Hypertension Cholestasis of Pregnancy     Discharge diagnosis: Term Pregnancy Delivered                                                                                                Post partum procedures: none  Augmentation: Pitocin and Cytotec  Complications: None  Hospital course:  Induction of Labor With Vaginal Delivery   30 y.o. yo G3P0020 at [redacted]w[redacted]d was admitted to the hospital 07/25/2021 for induction of labor.  Indication for induction: Gestational hypertension and presumptive cholestasis of pregnancy (Bile acids pending) .  Patient had an uncomplicated labor course as follows: Membrane Rupture Time/Date: 6:37 AM ,07/26/2021   Delivery Method:Vaginal, Spontaneous  Episiotomy: None  Lacerations:  2nd degree;Perineal  Details of delivery can be found in separate delivery note.  Patient had a routine postpartum course. Patient is discharged home 07/28/21.  Newborn Data: Birth date:07/26/2021  Birth time:8:00 PM  Gender:Female  Living status:Living  Apgars:8 ,9  Weight:3660 g   Physical exam  Vitals:   07/27/21 1129 07/27/21 1537 07/27/21 2306 07/28/21 0829  BP: 107/79 (!) 114/94 (!) 116/97 (!) 128/93  Pulse: 93 (!) 111 (!) 104 89  Resp: 18 20 18 18   Temp: (!) 97.5 F (36.4 C) (!) 97.5 F (36.4 C) 98.1 F (36.7 C) 97.6 F (36.4 C)  TempSrc: Oral Oral Oral Oral  SpO2: 99% 100% 96% 100%  Weight:      Height:       General: alert, cooperative, and no distress Lochia: appropriate DVT Evaluation: No evidence of DVT seen on physical exam. Labs: Lab  Results  Component Value Date   WBC 12.8 (H) 07/27/2021   HGB 10.0 (L) 07/27/2021   HCT 30.2 (L) 07/27/2021   MCV 85.8 07/27/2021   PLT 192 07/27/2021   CMP Latest Ref Rng & Units 07/25/2021  Glucose 70 - 99 mg/dL 07/27/2021)  BUN 6 - 20 mg/dL 12  Creatinine 322(G - 2.54 mg/dL 2.70  Sodium 6.23 - 762 mmol/L 132(L)  Potassium 3.5 - 5.1 mmol/L 3.7  Chloride 98 - 111 mmol/L 103  CO2 22 - 32 mmol/L 21(L)  Calcium 8.9 - 10.3 mg/dL 9.0  Total Protein 6.5 - 8.1 g/dL 6.7  Total Bilirubin 0.3 - 1.2 mg/dL 0.8  Alkaline Phos 38 - 126 U/L 259(H)  AST 15 - 41 U/L 41  ALT 0 - 44 U/L 62(H)    Discharge instruction: per After Visit Summary and "Baby and Me Booklet".  After visit meds:  Allergies as of 07/28/2021       Reactions   Amoxicillin Rash   Cefaclor Hives, Rash   Clarithromycin Rash   Penicillins Hives, Rash   Sulfamethoxazole-trimethoprim Rash  Medication List     TAKE these medications    ibuprofen 600 MG tablet Commonly known as: ADVIL Take 1 tablet (600 mg total) by mouth every 6 (six) hours.   multivitamin-prenatal 27-0.8 MG Tabs tablet Take 1 tablet by mouth daily at 12 noon.        Diet: routine diet  Activity: Advance as tolerated. Pelvic rest for 6 weeks.   Outpatient follow up:1 week Follow up Appt:No future appointments. Follow up Visit:No follow-ups on file.  Postpartum contraception: Undecided  Newborn Data: Live born female  Birth Weight:   APGAR: 8, 9  Newborn Delivery   Birth date/time: 07/26/2021 20:00:00 Delivery type: Vaginal, Spontaneous      Baby Feeding: Breast Disposition:home with mother   07/28/2021 Vena Austria, MD

## 2021-07-26 NOTE — Progress Notes (Signed)
Subjective:  Comfortable epidural in place  Objective:   Vitals: Blood pressure 112/64, pulse 74, temperature 98.2 F (36.8 C), temperature source Oral, resp. rate 18, height 5\' 5"  (1.651 m), weight 86.6 kg, last menstrual period 10/26/2020, SpO2 97 %. General: NAD Abdomen: gravid, non-tender Cervical Exam:  Dilation: 6 Effacement (%): 100 Cervical Position: Middle Station: 0 Presentation: Vertex Exam by:: Emmons Toth MD  FHT: 130, moderate, +accels, no decels Toco:q55min  Results for orders placed or performed during the hospital encounter of 07/25/21 (from the past 24 hour(s))  Resp Panel by RT-PCR (Flu A&B, Covid) Nasopharyngeal Swab     Status: None   Collection Time: 07/25/21  6:29 PM   Specimen: Nasopharyngeal Swab; Nasopharyngeal(NP) swabs in vial transport medium  Result Value Ref Range   SARS Coronavirus 2 by RT PCR NEGATIVE NEGATIVE   Influenza A by PCR NEGATIVE NEGATIVE   Influenza B by PCR NEGATIVE NEGATIVE  CBC     Status: Abnormal   Collection Time: 07/25/21  6:29 PM  Result Value Ref Range   WBC 7.6 4.0 - 10.5 K/uL   RBC 4.04 3.87 - 5.11 MIL/uL   Hemoglobin 11.7 (L) 12.0 - 15.0 g/dL   HCT 07/27/21 (L) 70.2 - 63.7 %   MCV 84.2 80.0 - 100.0 fL   MCH 29.0 26.0 - 34.0 pg   MCHC 34.4 30.0 - 36.0 g/dL   RDW 85.8 85.0 - 27.7 %   Platelets 216 150 - 400 K/uL   nRBC 0.0 0.0 - 0.2 %  Type and screen     Status: None   Collection Time: 07/25/21  6:29 PM  Result Value Ref Range   ABO/RH(D) A POS    Antibody Screen NEG    Sample Expiration      07/28/2021,2359 Performed at [facility], [address] [place], [place]   RPR     Status: None   Collection Time: 07/25/21  6:29 PM  Result Value Ref Range   RPR Ser Ql NON REACTIVE NON REACTIVE  Protein / creatinine ratio, urine     Status: Abnormal   Collection Time: 07/25/21  6:29 PM  Result Value Ref Range   Creatinine, Urine 78 mg/dL   Total Protein, Urine 13 mg/dL   Protein Creatinine Ratio  0.17 (H) 0.00 - 0.15 mg/mg[Cre]  Comprehensive metabolic panel     Status: Abnormal   Collection Time: 07/25/21  6:29 PM  Result Value Ref Range   Sodium 132 (L) 135 - 145 mmol/L   Potassium 3.7 3.5 - 5.1 mmol/L   Chloride 103 98 - 111 mmol/L   CO2 21 (L) 22 - 32 mmol/L   Glucose, Bld 109 (H) 70 - 99 mg/dL   BUN 12 6 - 20 mg/dL   Creatinine, Ser 07/27/21 0.44 - 1.00 mg/dL   Calcium 9.0 8.9 - 6.72 mg/dL   Total Protein 6.7 6.5 - 8.1 g/dL   Albumin 3.0 (L) 3.5 - 5.0 g/dL   AST 41 15 - 41 U/L   ALT 62 (H) 0 - 44 U/L   Alkaline Phosphatase 259 (H) 38 - 126 U/L   Total Bilirubin 0.8 0.3 - 1.2 mg/dL   GFR, Estimated 09.4 >70 mL/min   Anion gap 8 5 - 15    Assessment:   30 y.o. G3P0020 [redacted]w[redacted]d IOL GHTN, suspected cholestasis of pregnancy (bile acids pending)  Plan:   1) Labor - continue pitocin   2) Fetus - cat I tracing  3) GHTN -  normotensive since epidural  4) Cholestasis of pregnancy - mild ALT elevation, bile acids pending   Vena Austria, MD, [facility], [facility] 07/26/2021, 4:07 PM

## 2021-07-27 LAB — CBC
HCT: 30.2 % — ABNORMAL LOW (ref 36.0–46.0)
Hemoglobin: 10 g/dL — ABNORMAL LOW (ref 12.0–15.0)
MCH: 28.4 pg (ref 26.0–34.0)
MCHC: 33.1 g/dL (ref 30.0–36.0)
MCV: 85.8 fL (ref 80.0–100.0)
Platelets: 192 10*3/uL (ref 150–400)
RBC: 3.52 MIL/uL — ABNORMAL LOW (ref 3.87–5.11)
RDW: 13.4 % (ref 11.5–15.5)
WBC: 12.8 10*3/uL — ABNORMAL HIGH (ref 4.0–10.5)
nRBC: 0 % (ref 0.0–0.2)

## 2021-07-27 LAB — BILE ACIDS, TOTAL: Bile Acids Total: 30.5 umol/L — ABNORMAL HIGH (ref 0.0–10.0)

## 2021-07-27 NOTE — Lactation Note (Signed)
This note was copied from a baby's chart. Lactation Consultation Note  Patient Name: Carol Steele [id number] Date: 07/27/2021 Reason for consult: Initial assessment;Primapara;Term Age:30 hours  Maternal Data Has patient been taught Hand Expression?: Yes Does the patient have breastfeeding experience prior to this delivery?: No  Feeding Mother's Current Feeding Choice: Breast Milk Baby nursing on left breast in cradle hold when room entered, observed wide open mouth and flanged lips, mom reports mild pinching, does not decrease with chin pressure, encouraged mom to use coconut oil after each feed, do not wipe off   LATCH Score Latch: Grasps breast easily, tongue down, lips flanged, rhythmical sucking.  Audible Swallowing: Spontaneous and intermittent  Type of Nipple: Everted at rest and after stimulation  Comfort (Breast/Nipple): Filling, red/small blisters or bruises, mild/mod discomfort  Hold (Positioning): No assistance needed to correctly position infant at breast.  LATCH Score: 9   Lactation Tools Discussed/Used  LC name and no written on white board  Interventions Interventions: Adjust position;Coconut oil  Discharge Pump: Personal WIC Program: No  Consult Status Consult Status: PRN    Dyann Kief 07/27/2021, 5:55 PM

## 2021-07-27 NOTE — Anesthesia Postprocedure Evaluation (Signed)
Anesthesia Post Note  Patient: Carol Steele  Procedure(s) Performed: AN AD HOC LABOR EPIDURAL  Patient location during evaluation: Mother Baby Anesthesia Type: Epidural Level of consciousness: awake and alert Pain management: pain level controlled Vital Signs Assessment: post-procedure vital signs reviewed and stable Respiratory status: spontaneous breathing, nonlabored ventilation and respiratory function stable Cardiovascular status: stable Postop Assessment: no headache, no backache and epidural receding Anesthetic complications: no   No notable events documented.   Last Vitals:  Vitals:   07/27/21 0241 07/27/21 0735  BP: 100/75 106/89  Pulse: (!) 102 98  Resp: 15 18  Temp: 36.7 C 36.4 C  SpO2: 99% 100%    Last Pain:  Vitals:   07/27/21 0829  TempSrc:   PainSc: 0-No pain                 Maxine Fredman Lawerance Cruel

## 2021-07-27 NOTE — Progress Notes (Signed)
Post Partum Day 1 Subjective: no complaints, up ad lib, voiding, and tolerating PO  Objective: Blood pressure 106/89, pulse 98, temperature 97.6 F (36.4 C), temperature source Oral, resp. rate 18, height 5\' 5"  (1.651 m), weight 86.6 kg, last menstrual period 10/26/2020, SpO2 100 %, unknown if currently breastfeeding.  Physical Exam:  General: alert, cooperative, and no distress Lochia: appropriate Uterine Fundus: firm Incision: healing well, some perineal edema DVT Evaluation: No evidence of DVT seen on physical exam. Negative Homan's sign.  Recent Labs    07/25/21 1829 07/27/21 0421  HGB 11.7* 10.0*  HCT 34.0* 30.2*    Assessment/Plan: Plan for discharge tomorrow and Breastfeeding Continue postpartum orders.   LOS: 2 days   07/29/21 07/27/2021, 10:08 AM

## 2021-07-28 MED ORDER — IBUPROFEN 600 MG PO TABS: 600.0000 mg | ORAL_TABLET | Freq: Four times a day (QID) | ORAL | 0 refills | Status: DC

## 2021-07-28 NOTE — Progress Notes (Signed)
Patient discharged with infant. Discharge instructions, prescriptions, and follow up appointments given to and reviewed with patient. Patient verbalized understanding. Will be escorted out by axillary.  °

## 2021-08-01 ENCOUNTER — Other Ambulatory Visit: Payer: Self-pay

## 2021-08-01 ENCOUNTER — Encounter: Payer: Self-pay | Admitting: Obstetrics and Gynecology

## 2021-08-01 ENCOUNTER — Ambulatory Visit (INDEPENDENT_AMBULATORY_CARE_PROVIDER_SITE_OTHER): Payer: 59 | Admitting: Obstetrics and Gynecology

## 2021-08-01 VITALS — BP 150/94 | Ht 65.0 in | Wt 176.0 lb

## 2021-08-01 DIAGNOSIS — F53 Postpartum depression: Secondary | ICD-10-CM

## 2021-08-01 DIAGNOSIS — O139 Gestational [pregnancy-induced] hypertension without significant proteinuria, unspecified trimester: Secondary | ICD-10-CM

## 2021-08-01 DIAGNOSIS — O26613 Liver and biliary tract disorders in pregnancy, third trimester: Secondary | ICD-10-CM

## 2021-08-01 DIAGNOSIS — K831 Obstruction of bile duct: Secondary | ICD-10-CM

## 2021-08-01 MED ORDER — HYDROXYZINE HCL 25 MG PO TABS: 25.0000 mg | ORAL_TABLET | Freq: Four times a day (QID) | ORAL | 2 refills | Status: DC | PRN

## 2021-08-01 MED ORDER — LABETALOL HCL 200 MG PO TABS: 200.0000 mg | ORAL_TABLET | Freq: Two times a day (BID) | ORAL | 3 refills | Status: DC

## 2021-08-01 MED ORDER — ESCITALOPRAM OXALATE 10 MG PO TABS: 10.0000 mg | ORAL_TABLET | Freq: Every day | ORAL | 2 refills | Status: DC

## 2021-08-01 NOTE — Progress Notes (Signed)
Obstetrics & Gynecology Office Visit   Chief Complaint:  Chief Complaint  Patient presents with   Post-op Follow-up    31wk postpartum - very emotional. RM 2    History of Present Illness: 31 y.o. [id number] being seen for follow up blood pressure check today.  The patient is  postpartum The established diagnosis for the patient is gestational hypertension.  She is currently on no antihypertensives.  She reports no current symptoms attributable to her blood pressure.  Medication list reviewed medications which may contribute to BP elevation were not noted.  Review of Systems: Review of Systems  Constitutional: Negative.   Cardiovascular:  Negative for chest pain.  Gastrointestinal: Negative.   Genitourinary: Negative.   Neurological:  Negative for headaches.  Psychiatric/Behavioral:  Positive for depression. Negative for hallucinations, memory loss, substance abuse and suicidal ideas. The patient is nervous/anxious and has insomnia.     Past Medical History:  Past Medical History:  Diagnosis Date   Anxiety 2020    Past Surgical History:  Past Surgical History:  Procedure Laterality Date   DILATION AND EVACUATION N/A 08/26/2019   Procedure: SUCTION D&C;  Surgeon: Malachy Mood, MD;  Location: [facility] ORS;  Service: Gynecology;  Laterality: N/A;    Gynecologic History: No LMP recorded.  Obstetric History: [id number]  Family History:  Family History  Problem Relation Age of Onset   Anxiety disorder Mother    Hypertension Father    Heart attack Father    Breast cancer Maternal Grandmother 51       has contact   Prostate cancer Maternal Grandfather 60   Diabetes Maternal Grandfather    Heart failure Maternal Grandfather    Kidney disease Maternal Grandfather     Social History:  Social History   Socioeconomic History   Marital status: Married    Spouse name: Josh   Number of children: Not on file   Years of education: Not on file   Highest education level: Not  on file  Occupational History   Not on file  Tobacco Use   Smoking status: Former   Smokeless tobacco: Never  Scientific laboratory technician Use: Never used  Substance and Sexual Activity   Alcohol use: Not Currently    Comment: occ   Drug use: Never   Sexual activity: Yes    Birth control/protection: None  Other Topics Concern   Not on file  Social History Narrative   Not on file   Social Determinants of Health   Financial Resource Strain: Not on file  Food Insecurity: Not on file  Transportation Needs: Not on file  Physical Activity: Not on file  Stress: Not on file  Social Connections: Not on file  Intimate Partner Violence: Not on file    Allergies:  Allergies  Allergen Reactions   Amoxicillin Rash   Cefaclor Hives and Rash   Clarithromycin Rash   Penicillins Hives and Rash   Sulfamethoxazole-Trimethoprim Rash    Medications: Prior to Admission medications   Medication Sig Start Date End Date Taking? Authorizing Provider  ibuprofen (ADVIL) 600 MG tablet Take 1 tablet (600 mg total) by mouth every 6 (six) hours. 07/28/21   Malachy Mood, MD  Prenatal Vit-Fe Fumarate-FA (MULTIVITAMIN-PRENATAL) 27-0.8 MG TABS tablet Take 1 tablet by mouth daily at 12 noon.    [provider]    Physical Exam Blood pressure (!) 157/103, height 5\' 5"  (1.651 m), weight 176 lb (79.8 kg), currently breastfeeding.  No LMP recorded.  General: NAD HEENT: normocephalic, anicteric Pulmonary: No increased work of breathing Neurologic: Grossly intact Psychiatric: mood appropriate, affect full  Assessment: 31 y.o. [id number] presenting for blood pressure evaluation today  Plan: Problem List Items Addressed This Visit   None Visit Diagnoses     Cholestasis during pregnancy in third trimester    -  Primary   Postpartum depression       Relevant Medications   escitalopram (LEXAPRO) 10 MG tablet   hydrOXYzine (ATARAX) 25 MG tablet   Gestational hypertension, antepartum        Relevant Medications   labetalol (NORMODYNE) 200 MG tablet       1) Blood pressure - blood pressure at today's visit is elevated.  As a result antihypertensive therapy will be initiated.  GHTN based on L&D laboratory evaluation.  No headaches or vision changes - additional blood work  - start labetalol 200mg  po bidwas not obtained  2) Cholestasis of pregnancy - bile acid post delivery 30.5umol/L confirmatory.  Discussed recurrence risk is present in future pregnancies.    3) Anxiety - was previously on lexapro restart lexapro 10mg  po daily and prn vistaril  4) Postpartum contraception - interested in Mirena IUD  5) Return in about 1 week (around 08/08/2021) for BP recheck medication follow 1 week.    Malachy Mood, MD, [facility], [facility] 08/01/2021, 2:45 PM

## 2021-08-07 DIAGNOSIS — R102 Pelvic and perineal pain: Secondary | ICD-10-CM | POA: Diagnosis not present

## 2021-08-09 ENCOUNTER — Encounter: Payer: Self-pay | Admitting: Obstetrics and Gynecology

## 2021-08-09 ENCOUNTER — Other Ambulatory Visit: Payer: Self-pay

## 2021-08-09 ENCOUNTER — Ambulatory Visit (INDEPENDENT_AMBULATORY_CARE_PROVIDER_SITE_OTHER): Payer: 59 | Admitting: Obstetrics and Gynecology

## 2021-08-09 VITALS — BP 122/80 | Ht 65.0 in | Wt 169.0 lb

## 2021-08-09 DIAGNOSIS — Z0130 Encounter for examination of blood pressure without abnormal findings: Secondary | ICD-10-CM

## 2021-08-09 NOTE — Progress Notes (Signed)
Obstetrics & Gynecology Office Visit   Chief Complaint:  Chief Complaint  Patient presents with   Follow-up    Bp check - no concerns. RM 3    History of Present Illness: 31 y.o. [id number] being seen for follow up blood pressure check today.  The patient is  postpartum The established diagnosis for the patient is gestational hypertension.  She is currently on labetalol 100mg .  She reports no current symptoms attributable to her blood pressure.  Medication list reviewed medications which may contribute to BP elevation were not noted.  Review of Systems: Review of Systems  Constitutional: Negative.   Gastrointestinal: Negative.   Neurological:  Negative for headaches.  Psychiatric/Behavioral: Negative.      Past Medical History:  Past Medical History:  Diagnosis Date   Anxiety 2020    Past Surgical History:  Past Surgical History:  Procedure Laterality Date   DILATION AND EVACUATION N/A 08/26/2019   Procedure: SUCTION D&C;  Surgeon: 08/28/2019, MD;  Location: [facility] ORS;  Service: Gynecology;  Laterality: N/A;    Gynecologic History: No LMP recorded.  Obstetric History: Vena Austria  Family History:  Family History  Problem Relation Age of Onset   Anxiety disorder Mother    Hypertension Father    Heart attack Father    Breast cancer Maternal Grandmother 67       has contact   Prostate cancer Maternal Grandfather 60   Diabetes Maternal Grandfather    Heart failure Maternal Grandfather    Kidney disease Maternal Grandfather     Social History:  Social History   Socioeconomic History   Marital status: Married    Spouse name: Josh   Number of children: Not on file   Years of education: Not on file   Highest education level: Not on file  Occupational History   Not on file  Tobacco Use   Smoking status: Former   Smokeless tobacco: Never  61 Use: Never used  Substance and Sexual Activity   Alcohol use: Not Currently    Comment: occ    Drug use: Never   Sexual activity: Yes    Birth control/protection: None  Other Topics Concern   Not on file  Social History Narrative   Not on file   Social Determinants of Health   Financial Resource Strain: Not on file  Food Insecurity: Not on file  Transportation Needs: Not on file  Physical Activity: Not on file  Stress: Not on file  Social Connections: Not on file  Intimate Partner Violence: Not on file    Allergies:  Allergies  Allergen Reactions   Amoxicillin Rash   Cefaclor Hives and Rash   Clarithromycin Rash   Penicillins Hives and Rash   Sulfamethoxazole-Trimethoprim Rash    Medications: Prior to Admission medications   Medication Sig Start Date End Date Taking? Authorizing Provider  escitalopram (LEXAPRO) 10 MG tablet Take 1 tablet (10 mg total) by mouth daily. 08/01/21 08/01/22  09/30/22, MD  hydrOXYzine (ATARAX) 25 MG tablet Take 1 tablet (25 mg total) by mouth every 6 (six) hours as needed for anxiety. 08/01/21   09/29/21, MD  ibuprofen (ADVIL) 600 MG tablet Take 1 tablet (600 mg total) by mouth every 6 (six) hours. 07/28/21   07/30/21, MD  labetalol (NORMODYNE) 200 MG tablet Take 1 tablet (200 mg total) by mouth 2 (two) times daily. 08/01/21   09/29/21, MD  Prenatal Vit-Fe Fumarate-FA (MULTIVITAMIN-PRENATAL) 27-0.8 MG TABS  tablet Take 1 tablet by mouth daily at 12 noon.    [provider]    Physical Exam Blood pressure 122/80, height 5\' 5"  (1.651 m), weight 169 lb (76.7 kg), currently breastfeeding.  No LMP recorded.  General: NAD HEENT: normocephalic, anicteric Extremities: noedema, no erythema, no tenderness Neurologic: Grossly intact Psychiatric: mood appropriate, affect full  Assessment: 31 y.o. 26 presenting for blood pressure evaluation today  Plan: Problem List Items Addressed This Visit   None Visit Diagnoses     BP check    -  Primary       1) Blood pressure - blood pressure at today's  visit is normotensive.  As a result antihypertensive therapy is currently not warranted. - additional blood work was not obtained - may discontinue labetalol   [id number], MD, [facility] Austria [facility], [facility] 08/09/2021, 2:25 PM

## 2021-09-06 ENCOUNTER — Ambulatory Visit (INDEPENDENT_AMBULATORY_CARE_PROVIDER_SITE_OTHER): Payer: 59 | Admitting: Obstetrics

## 2021-09-06 ENCOUNTER — Encounter: Payer: Self-pay | Admitting: Obstetrics

## 2021-09-06 ENCOUNTER — Other Ambulatory Visit: Payer: Self-pay

## 2021-09-06 VITALS — BP 104/60 | Ht 65.0 in | Wt 167.0 lb

## 2021-09-06 DIAGNOSIS — F53 Postpartum depression: Secondary | ICD-10-CM

## 2021-09-06 DIAGNOSIS — Z30430 Encounter for insertion of intrauterine contraceptive device: Secondary | ICD-10-CM

## 2021-09-06 MED ORDER — ESCITALOPRAM OXALATE 10 MG PO TABS: 10.0000 mg | ORAL_TABLET | Freq: Every day | ORAL | 11 refills | Status: AC

## 2021-09-06 MED ORDER — MISOPROSTOL 200 MCG PO TABS: 200.0000 ug | ORAL_TABLET | Freq: Once | ORAL | 0 refills | Status: DC

## 2021-09-06 NOTE — Progress Notes (Signed)
Postpartum Visit  Chief Complaint:  Chief Complaint  Patient presents with   Postpartum Care    History of Present Illness: Patient is a 31 y.o. [id number] presents for postpartum visit.  Date of delivery: 07/26/2021 Type of delivery: Vaginal delivery - Vacuum or forceps assisted  no Episiotomy No.  Laceration: yes 2nd degree with repair Pregnancy or labor problems:  no Any problems since the delivery:  no  Newborn Details:  SINGLETON :  1. Baby boy.  Birth weight: 3660 g  Maternal Details:  Breast Feeding:  no Post partum depression/anxiety noted:  no Edinburgh Post-Partum Depression Score:  not obtained, patient states anxiety/depression managed by Lexapro   Date of last PAP: 02/10/2019  normal   Past Medical History:  Diagnosis Date   Anxiety 2020    Past Surgical History:  Procedure Laterality Date   DILATION AND EVACUATION N/A 08/26/2019   Procedure: SUCTION D&C;  Surgeon: Vena Austria, MD;  Location: [facility] ORS;  Service: Gynecology;  Laterality: N/A;    Prior to Admission medications   Medication Sig Start Date End Date Taking? Authorizing Provider  escitalopram (LEXAPRO) 10 MG tablet Take 1 tablet (10 mg total) by mouth daily. 08/01/21 08/01/22 Yes Vena Austria, MD  Prenatal Vit-Fe Fumarate-FA (MULTIVITAMIN-PRENATAL) 27-0.8 MG TABS tablet Take 1 tablet by mouth daily at 12 noon.   Yes [provider]  hydrOXYzine (ATARAX) 25 MG tablet Take 1 tablet (25 mg total) by mouth every 6 (six) hours as needed for anxiety. Patient not taking: Reported on 09/06/2021 08/01/21   Vena Austria, MD  ibuprofen (ADVIL) 600 MG tablet Take 1 tablet (600 mg total) by mouth every 6 (six) hours. Patient not taking: Reported on 09/06/2021 07/28/21   Vena Austria, MD  labetalol (NORMODYNE) 200 MG tablet Take 1 tablet (200 mg total) by mouth 2 (two) times daily. Patient not taking: Reported on 09/06/2021 08/01/21   Vena Austria, MD    Allergies  Allergen Reactions    Amoxicillin Rash   Cefaclor Hives and Rash   Clarithromycin Rash   Penicillins Hives and Rash   Sulfamethoxazole-Trimethoprim Rash     Social History   Socioeconomic History   Marital status: Married    Spouse name: Josh   Number of children: Not on file   Years of education: Not on file   Highest education level: Not on file  Occupational History   Not on file  Tobacco Use   Smoking status: Former   Smokeless tobacco: Never  Vaping Use   Vaping Use: Never used  Substance and Sexual Activity   Alcohol use: Not Currently    Comment: occ   Drug use: Never   Sexual activity: Yes    Birth control/protection: None  Other Topics Concern   Not on file  Social History Narrative   Not on file   Social Determinants of Health   Financial Resource Strain: Not on file  Food Insecurity: Not on file  Transportation Needs: Not on file  Physical Activity: Not on file  Stress: Not on file  Social Connections: Not on file  Intimate Partner Violence: Not on file    Family History  Problem Relation Age of Onset   Anxiety disorder Mother    Hypertension Father    Heart attack Father    Breast cancer Maternal Grandmother 60       has contact   Prostate cancer Maternal Grandfather 60   Diabetes Maternal Grandfather    Heart failure Maternal Grandfather  Kidney disease Maternal Grandfather     Review of Systems  Constitutional: Negative.   HENT: Negative.    Eyes: Negative.   Respiratory: Negative.    Cardiovascular: Negative.   Gastrointestinal: Negative.   Genitourinary: Negative.   Musculoskeletal: Negative.   Skin: Negative.   Neurological: Negative.   Endo/Heme/Allergies: Negative.   Psychiatric/Behavioral:  Positive for depression.        Managed by Lexapro.     Physical Exam BP 104/60    Ht 5\' 5"  (1.651 m)    Wt 167 lb (75.8 kg)    Breastfeeding Yes    BMI 27.79 kg/m   Physical Exam Constitutional:      Appearance: Normal appearance. She is normal weight.   Genitourinary:     Vulva normal.     Genitourinary Comments: Perineal repair healing well. Bimanual exam reveals an appropriately sized 6-week postpartum uterus, negative for CMT. Adnexae non tender. Suspect retroverted uterus due to tilt of cervix toward patient's posterior side.   HENT:     Head: Normocephalic and atraumatic.  Cardiovascular:     Rate and Rhythm: Normal rate and regular rhythm.     Pulses: Normal pulses.     Heart sounds: Normal heart sounds.  Pulmonary:     Effort: Pulmonary effort is normal.     Breath sounds: Normal breath sounds.  Neurological:     General: No focal deficit present.     Mental Status: She is alert and oriented to person, place, and time.  Skin:    General: Skin is warm and dry.     Capillary Refill: Capillary refill takes less than 2 seconds.  Psychiatric:        Mood and Affect: Mood normal.        Behavior: Behavior normal.        Thought Content: Thought content normal.        Judgment: Judgment normal.     Female Chaperone present during breast and/or pelvic exam.  Assessment: 31 y.o. 26 presenting for 6 week postpartum visit  Plan: Problem List Items Addressed This Visit   None Visit Diagnoses     Postpartum care following vaginal delivery    -  Primary     Counseling for contraception.  1) Contraception Education given regarding options for contraception, including IUD placement.  Patient desires Mirena IUD for contraception. Will schedule an appointment for placement within two weeks. Cytotec ordered for her to take the night before.  2)  Pap - ASCCP guidelines and rational discussed.  Patient opts for 3 year screening interval.   3) Patient underwent screening for postpartum depression.she has been back on Lexapro and feels that it is helpful. Rx for the year is sent out.  4) Follow up 1-2 weeks for her IUD insertion  [id number], CNM  09/06/2021 4:33 PM   09/06/2021 4:18 PM

## 2021-09-10 ENCOUNTER — Other Ambulatory Visit: Payer: Self-pay

## 2021-09-10 ENCOUNTER — Ambulatory Visit (INDEPENDENT_AMBULATORY_CARE_PROVIDER_SITE_OTHER): Payer: 59 | Admitting: Obstetrics

## 2021-09-10 ENCOUNTER — Encounter: Payer: Self-pay | Admitting: Obstetrics

## 2021-09-10 VITALS — BP 122/78 | Ht 65.0 in | Wt 169.0 lb

## 2021-09-10 DIAGNOSIS — Z30430 Encounter for insertion of intrauterine contraceptive device: Secondary | ICD-10-CM | POA: Insufficient documentation

## 2021-09-10 HISTORY — DX: Encounter for insertion of intrauterine contraceptive device: Z30.430

## 2021-09-10 NOTE — Progress Notes (Signed)
S: Carol Steele presents today for IUD placement. She had her 6 week PP physical a week ago, and has now decided to have an IUD inserted. She has been consented. She also took Ibuprofen and one Cytotec  in preparation.  O: BP 122/78    Ht 5\' 5"  (1.651 m)    Wt 169 lb (76.7 kg)    Breastfeeding Yes    BMI 28.12 kg/m  Review of Systems  Constitutional: Negative.   HENT: Negative.    Eyes: Negative.   Respiratory: Negative.    Cardiovascular: Negative.   Gastrointestinal: Negative.   Genitourinary: Negative.   Musculoskeletal: Negative.   Skin: Negative.   Neurological: Negative.   Endo/Heme/Allergies: Negative.   Psychiatric/Behavioral: Negative.     Physical Exam Constitutional:      Appearance: Normal appearance.  HENT:     Head: Normocephalic and atraumatic.  Cardiovascular:     Rate and Rhythm: Normal rate and regular rhythm.  Pulmonary:     Effort: Pulmonary effort is normal.     Breath sounds: Normal breath sounds.  Abdominal:     Palpations: Abdomen is soft.  Genitourinary:    General: Normal vulva.     Rectum: Normal.     Comments: No external lesions noted. No obvious vaginal discharge. Uterus is involuted since delivery,slightly anteverted. Her cervix appears from  her left side per spec exam, Utine sound reveals 5.5cms depth, even with gentle traction on the cervix. Musculoskeletal:        General: Normal range of motion.     Cervical back: Normal range of motion and neck supple.  Skin:    General: Skin is warm and dry.  Neurological:     General: No focal deficit present.     Mental Status: She is alert and oriented to person, place, and time.  Psychiatric:        Mood and Affect: Mood normal.        Behavior: Behavior normal.      Reviewed all forms of birth control options available including abstinence; over the counter/barrier methods; hormonal contraceptive medication including pill, patch, ring, injection,contraceptive implant; hormonal and nonhormonal IUDs;  permanent sterilization options including vasectomy and the various tubal sterilization modalities. Risks and benefits reviewed.  Questions were answered.  Information was given to patient to review.    IUD Insertion Procedure Note Patient identified, informed consent performed, consent signed.   Discussed risks of irregular bleeding, cramping, infection, malpositioning, expulsion or uterine perforation of the IUD (1:1000 placements)  which may require further procedure such as laparoscopy.  IUD while effective at preventing pregnancy do not prevent transmission of sexually transmitted diseases and use of barrier methods for this purpose was discussed. Time out was performed.  Urine pregnancy test negative.  Speculum placed in the vagina.  Cervix visualized.  Cleaned with Betadine x 2.  Grasped anteriorly with a single tooth tenaculum.  Uterus sounded to 5.5 cm.  Additional gentle traction applied to tenaculum.This is discussed in depth with the patient as the possibility of expulsion is likely higher with her shorter fundal height. IUD placed per manufacturer's recommendations.  Strings trimmed to 3 cm. Tenaculum was removed, good hemostasis noted.  Patient tolerated procedure well.   Patient was given post-procedure instructions.  She was advised to have backup contraception for one week.  Patient was also asked to check IUD strings periodically and follow up in 4 weeks for IUD check.    A: IUD placement      Shorter  uterine depth noted today P: She will RTC in 4 weeks for an IUD check. She is advised that any cramping or pelvic pain that is not relieved by Ibuprofen should b followed by a call to [facility].  Mirna Mires, CNM  09/10/2021 2:54 PM

## 2021-10-01 ENCOUNTER — Encounter: Payer: Self-pay | Admitting: Obstetrics

## 2021-10-08 ENCOUNTER — Other Ambulatory Visit: Payer: Self-pay

## 2021-10-08 ENCOUNTER — Encounter: Payer: Self-pay | Admitting: Obstetrics

## 2021-10-08 ENCOUNTER — Ambulatory Visit (INDEPENDENT_AMBULATORY_CARE_PROVIDER_SITE_OTHER): Payer: 59 | Admitting: Obstetrics

## 2021-10-08 VITALS — BP 118/70 | Wt 172.0 lb

## 2021-10-08 DIAGNOSIS — Z30431 Encounter for routine checking of intrauterine contraceptive device: Secondary | ICD-10-CM | POA: Diagnosis not present

## 2021-10-08 DIAGNOSIS — Z975 Presence of (intrauterine) contraceptive device: Secondary | ICD-10-CM

## 2021-10-08 NOTE — Progress Notes (Signed)
? ?  IUD String Check ? ?Subjctive: ?[name] presents for IUD string check.  She had a Mirena placed 6 weeks ago.  Since placement of her IUD she had only light vaginal bleeding.  She denies cramping or discomfort.  She has had intercourse since placement.  She has not checked the strings.  She denies any fever, chills, nausea, vomiting, or other complaints.   ? ?Objective: ?BP 118/70   Wt 172 lb (78 kg)   LMP  (Exact Date)   BMI 28.62 kg/m?  ?Physical Exam ?Constitutional:   ?   Appearance: Normal appearance.  ?Genitourinary:  ?   Vulva and rectum normal.  ?   Genitourinary Comments: Uterus is anteverted, cervix protrudes from her right side. No vaginal discharge noted. IUD strings are visible , extending 2 cms from the os.  ?Cardiovascular:  ?   Rate and Rhythm: Normal rate and regular rhythm.  ?Pulmonary:  ?   Effort: Pulmonary effort is normal.  ?   Breath sounds: Normal breath sounds.  ?Abdominal:  ?   General: Abdomen is flat.  ?   Palpations: Abdomen is soft.  ?Musculoskeletal:     ?   General: Normal range of motion.  ?   Cervical back: Normal range of motion and neck supple.  ?Neurological:  ?   Mental Status: She is alert.  ?Skin: ?   General: Skin is warm and dry.  ?Psychiatric:     ?   Mood and Affect: Mood normal.     ?   Behavior: Behavior normal.  ? ? ?Female chaperone was present for the entirety of the pelvic exam ? ?Assessment: ?31 y.o. year old female status post prior Mirena IUD placement 4 week ago, doing well. ? ?Plan: ?1.  The patient was given instructions to check her IUD strings monthly and call with any problems or concerns.  She should call for fevers, chills, abnormal vaginal discharge, pelvic pain, or other complaints. ?2.  She will return for a annual exam in 1 year.  All questions answered. ? ?20 minutes spent in face to face discussion with > 50% spent in counseling, management, and coordination of care for her newly-placed IUD.  Risks and benefits of IUD discussed  including the risks of irregular bleeding, cramping, infection, malpositioning, expulsion, which may require further procedures such as laparoscopy.  IUDs while effective at preventing pregnancy do not prevent transmission of sexually transmitted diseases and use of barrier methods for this purpose was discussed.  Low overall incidence of failure with 99.7% efficacy rate in typical use.   ? ?Mirna Mires, CNM  ?10/08/2021 4:14 PM  ? ?10/08/2021 4:10 PM    ?

## 2021-10-25 ENCOUNTER — Telehealth: Payer: Self-pay

## 2021-10-25 NOTE — Telephone Encounter (Signed)
Pt calling for return to work note for tomorrow.  [phone]  Pt aware should be able to see return to work note in [place]. ?

## 2022-03-04 ENCOUNTER — Encounter: Payer: Self-pay | Admitting: Obstetrics

## 2022-03-30 ENCOUNTER — Other Ambulatory Visit: Payer: Self-pay | Admitting: Obstetrics

## 2022-03-30 DIAGNOSIS — F53 Postpartum depression: Secondary | ICD-10-CM

## 2022-04-02 NOTE — Telephone Encounter (Signed)
Left voicemail requesting return call regarding refill request received from pharmacy.

## 2022-07-02 NOTE — Telephone Encounter (Signed)
Rx sent 09/06/21 #30 w/11 rf. Patient reports in my chart message she has refills of her current rx. RF request sent by pharmacy in error.

## 2023-08-31 IMAGING — US US OB COMP +14 WK
2 series · 15 of 28 positions shown · non-contrast
Comparison: none

CLINICAL DATA: Second trimester pregnancy for fetal anatomy survey.

EXAM:
OBSTETRICAL ULTRASOUND >14 WKS

[Series 1: us ob comp + 14 wk · 14 of 76 slices shown (1 of 2)]
[im 1/76]
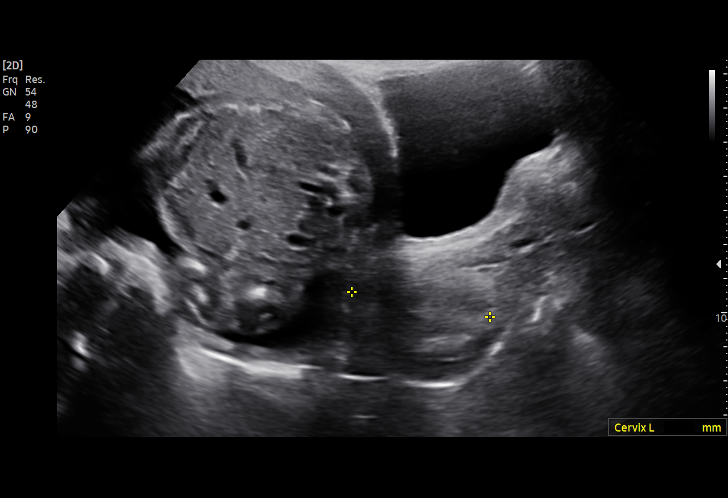
[im 6/76]
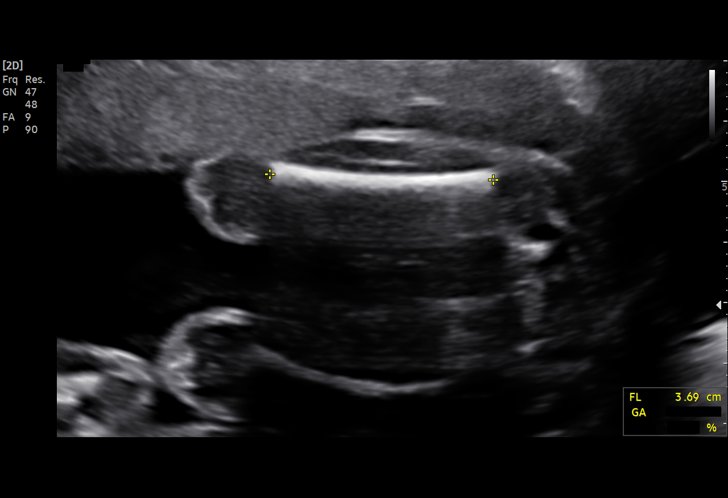
[im 12/76]
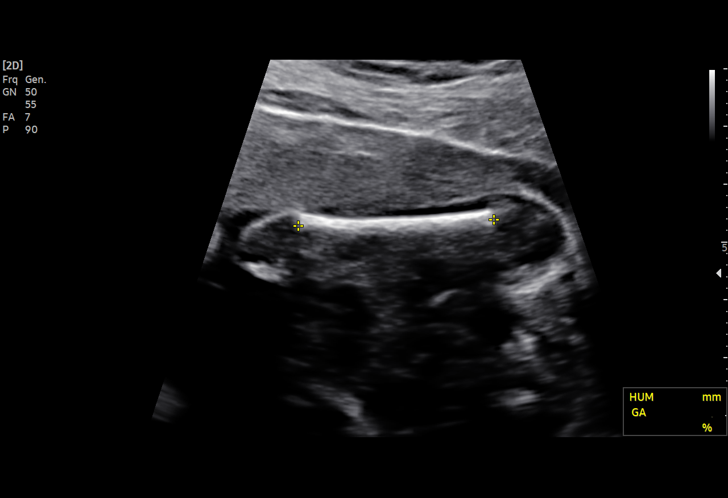
[im 18/76]
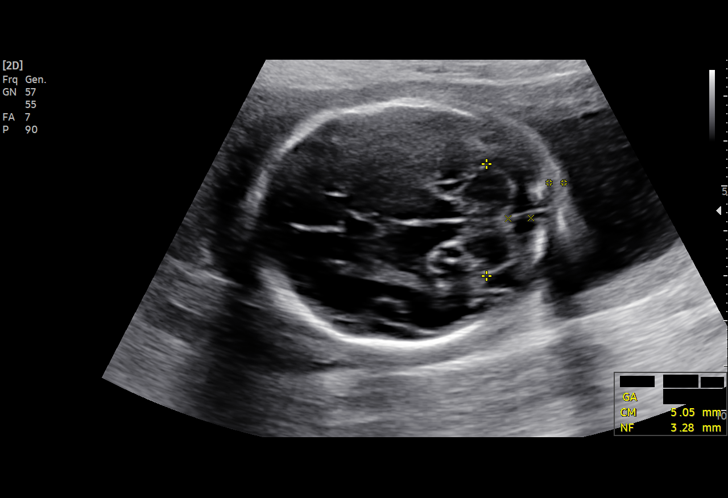
[im 24/76]
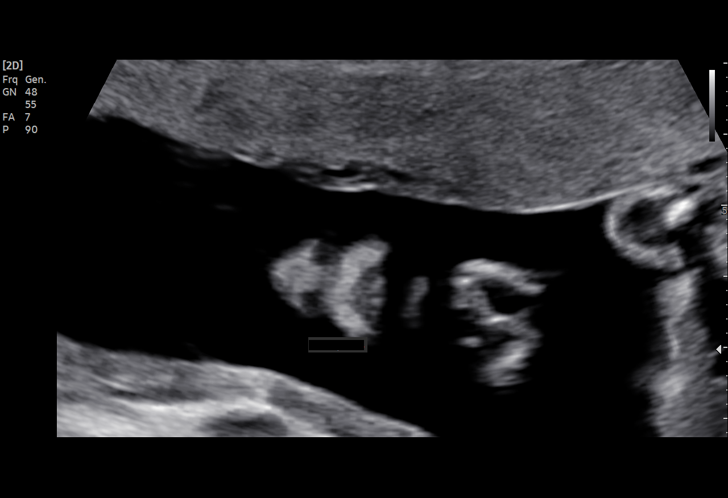
[im 29/76]
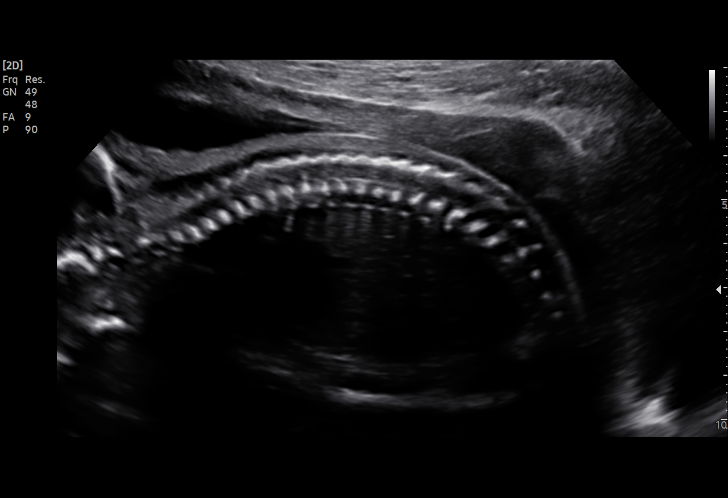
[im 35/76]
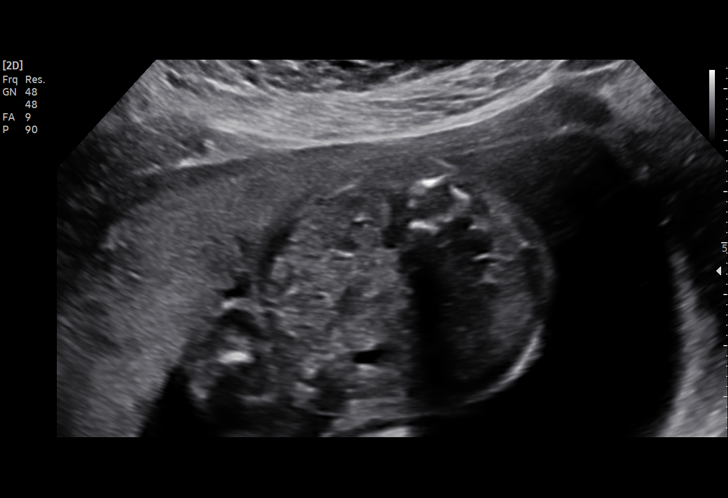
[im 41/76]
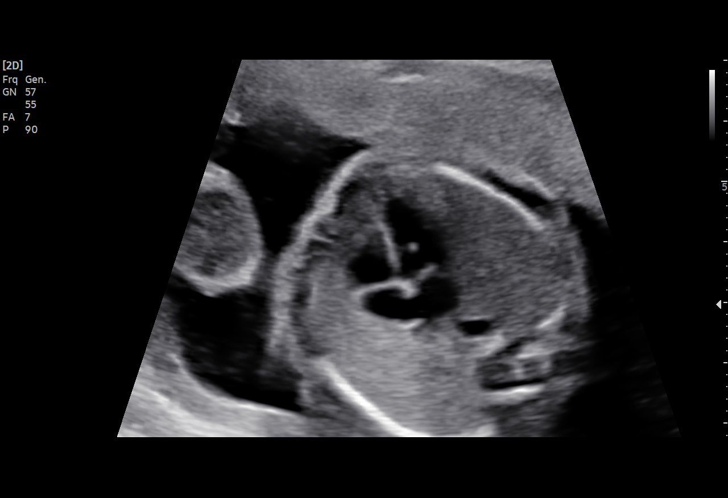
[im 44/76]
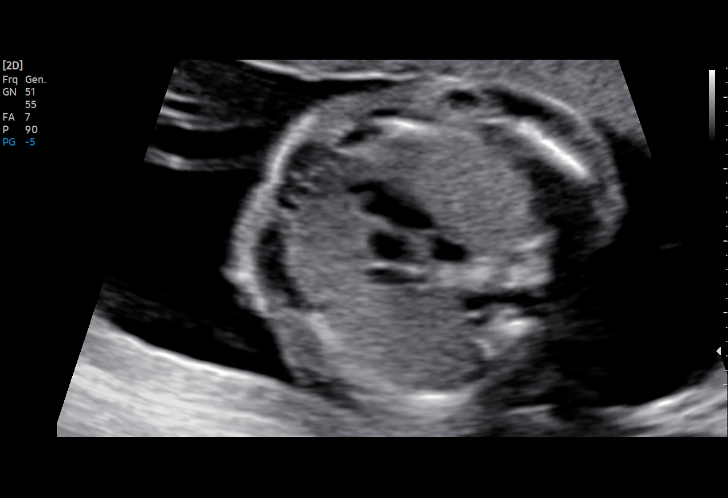
[im 50/76]
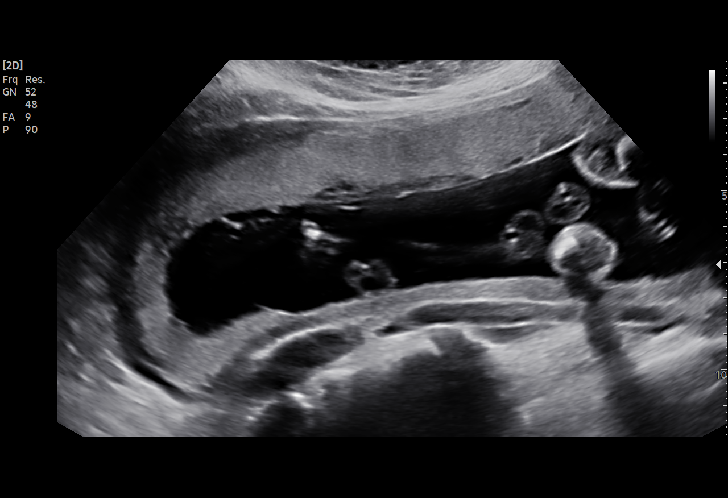
[im 55/76]
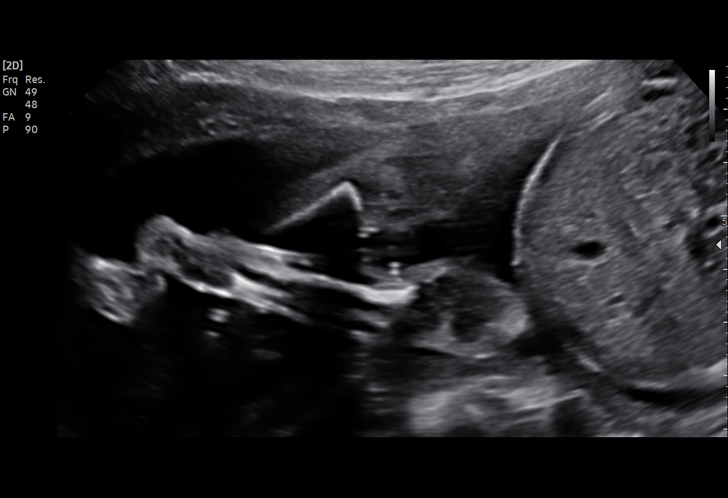
[im 61/76]
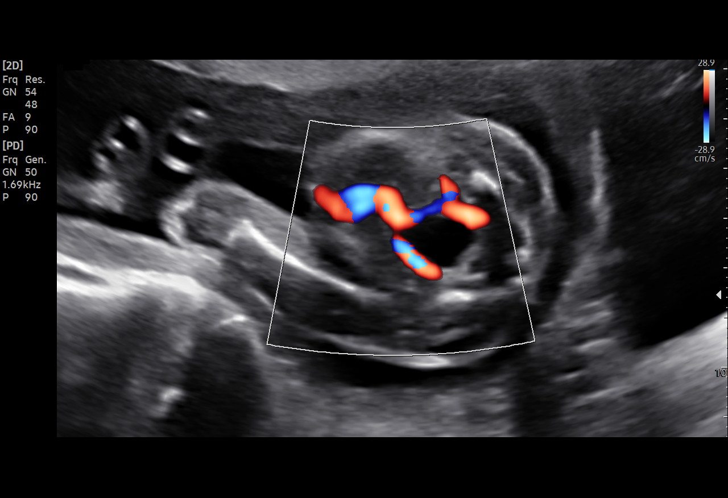
[im 67/76]
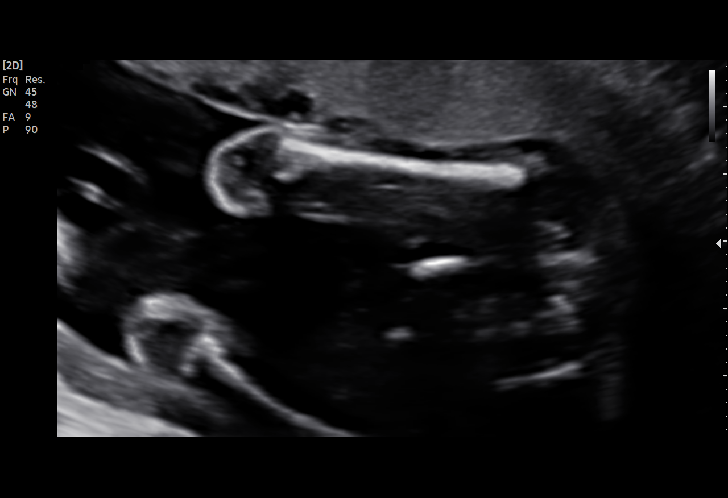
[im 73/76]
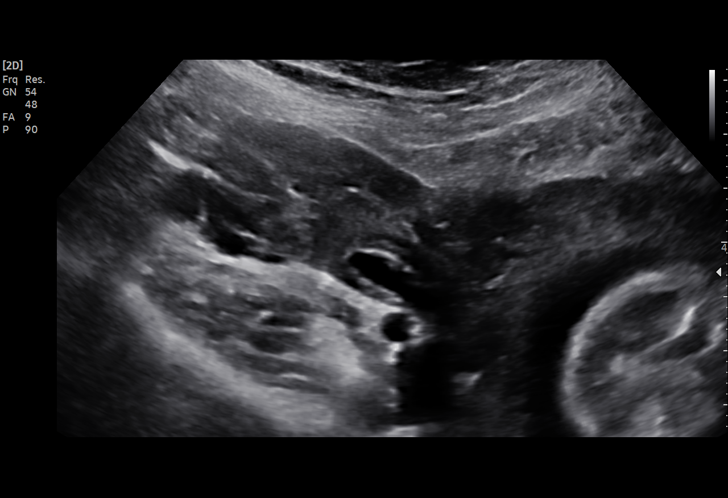

[Series 3: us ob comp + 14 wk · 1 of 2 slices shown (2 of 2)]
[im 1/2]
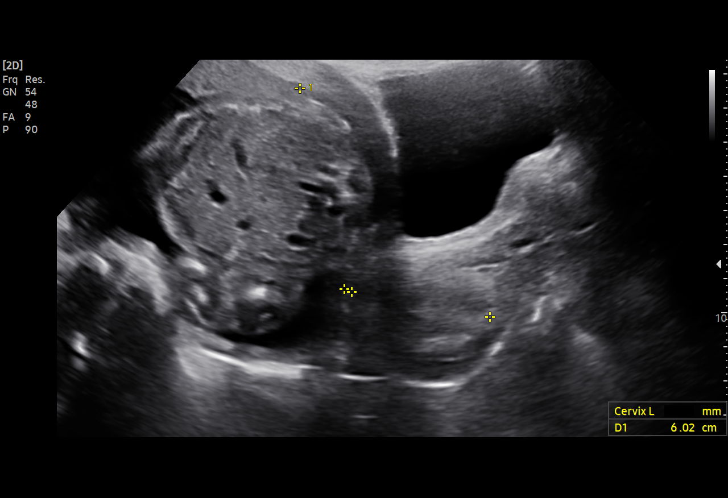

[15 of 28 positions shown; findings below may reference images not displayed]

FINDINGS: Number of Fetuses: 1

Heart Rate:  145 bpm

Movement: Yes

Presentation: Breech

Previa: No

Placental Location: Anterior

Amniotic Fluid (Subjective): Within normal limits

Amniotic Fluid (Objective):

Vertical pocket = 4.5cm

FETAL BIOMETRY

BPD: 5.5cm 22w 5d

HC:   20.7cm 22w 5d

AC:   18.2cm 23w 0d

FL:   3.7cm 21w 5d

Current Mean GA: 22w 2d US EDC: 07/30/2021

Assigned GA:  21w 6d Assigned EDC: 08/02/2021

FETAL ANATOMY

Lateral Ventricles: Appears normal

Thalami/CSP: Appears normal

Posterior Fossa:  Appears normal

Nuchal Region: Appears normal   NFT= N/A > 20 WKS

Upper Lip: Appears normal

Spine: Appears normal

4 Chamber Heart on Left: Appears normal

LVOT: Appears normal

RVOT: Appears normal

Stomach on Left: Appears normal

3 Vessel Cord: Appears normal

Cord Insertion site: Appears normal

Kidneys: Appears normal

Bladder: Appears normal

Extremities: Appears normal

Sex: Male

Maternal Findings:

Cervix:  4.1 cm TA
IMPRESSION: Assigned GA currently 21 weeks 6 days.  Appropriate fetal growth.

Unremarkable fetal anatomic survey.  No fetal anomalies identified.
